# Patient Record
Sex: Female | Born: 1955 | Race: White | Hispanic: No | Smoking: Never smoker
Health system: Southern US, Community
[De-identification: ages and names within clinical notes are randomized; demographics above are authoritative.]

## PROBLEM LIST (undated history)

## (undated) DIAGNOSIS — J349 Unspecified disorder of nose and nasal sinuses: Secondary | ICD-10-CM

## (undated) DIAGNOSIS — G5601 Carpal tunnel syndrome, right upper limb: Secondary | ICD-10-CM

## (undated) DIAGNOSIS — Z87442 Personal history of urinary calculi: Secondary | ICD-10-CM

## (undated) DIAGNOSIS — M722 Plantar fascial fibromatosis: Secondary | ICD-10-CM

## (undated) DIAGNOSIS — K219 Gastro-esophageal reflux disease without esophagitis: Secondary | ICD-10-CM

## (undated) DIAGNOSIS — F419 Anxiety disorder, unspecified: Secondary | ICD-10-CM

## (undated) DIAGNOSIS — M1990 Unspecified osteoarthritis, unspecified site: Secondary | ICD-10-CM

## (undated) DIAGNOSIS — I4719 Other supraventricular tachycardia: Secondary | ICD-10-CM

## (undated) HISTORY — PX: CHOLECYSTECTOMY: SHX55

## (undated) HISTORY — PX: WISDOM TOOTH EXTRACTION: SHX21

## (undated) HISTORY — PX: VARICOSE VEIN SURGERY: SHX832

## (undated) HISTORY — PX: HYSTEROSCOPY: SHX211

## (undated) HISTORY — PX: OTHER SURGICAL HISTORY: SHX169

## (undated) HISTORY — PX: HYMENECTOMY: SHX5853

## (undated) HISTORY — DX: Other supraventricular tachycardia: I47.19

---

## 2002-02-26 ENCOUNTER — Ambulatory Visit (HOSPITAL_COMMUNITY): Admission: RE | Admit: 2002-02-26 | Discharge: 2002-02-26 | Payer: Self-pay | Admitting: Family Medicine

## 2002-02-26 ENCOUNTER — Encounter: Payer: Self-pay | Admitting: Family Medicine

## 2002-03-11 ENCOUNTER — Encounter: Payer: Self-pay | Admitting: Family Medicine

## 2002-03-11 ENCOUNTER — Ambulatory Visit (HOSPITAL_COMMUNITY): Admission: RE | Admit: 2002-03-11 | Discharge: 2002-03-11 | Payer: Self-pay | Admitting: Family Medicine

## 2002-04-19 ENCOUNTER — Encounter (HOSPITAL_COMMUNITY): Admission: RE | Admit: 2002-04-19 | Discharge: 2002-05-19 | Payer: Self-pay | Admitting: Family Medicine

## 2002-04-19 ENCOUNTER — Encounter: Payer: Self-pay | Admitting: Family Medicine

## 2002-05-28 ENCOUNTER — Ambulatory Visit (HOSPITAL_COMMUNITY): Admission: RE | Admit: 2002-05-28 | Discharge: 2002-05-28 | Payer: Self-pay | Admitting: Internal Medicine

## 2007-07-01 ENCOUNTER — Ambulatory Visit (HOSPITAL_COMMUNITY): Admission: RE | Admit: 2007-07-01 | Discharge: 2007-07-01 | Payer: Self-pay | Admitting: Family Medicine

## 2010-06-01 NOTE — Op Note (Signed)
NAME:  [name], [name]                            ACCOUNT NO.:  [id number]   MEDICAL RECORD NO.:  [id number]                   PATIENT TYPE:  AMB   LOCATION:  DAY                                  FACILITY:  [facility]   PHYSICIAN:  R. Roetta Sessions, M.D.              DATE OF BIRTH:  1955-02-19   DATE OF PROCEDURE:  DATE OF DISCHARGE:                                 OPERATIVE REPORT   PROCEDURE:  Diagnostic esophagogastroduodenoscopy.   INDICATIONS:  The patient is a 55 year old morbidly obese lady with  intermittent right upper quadrant abdominal pain typically related to binge  eating.  Prior gallbladder HIDA scan and upper GI series all within normal  limits.  She has had some improvement on Prilosec 20 mg orally daily.  She  tends to do well when she does not take in excessive calories at one time.  EGD is now being done to further evaluate her upper GI tract.  This approach  has been discussed with the patient previously.  The potential risks,  benefits and alternatives have been reviewed, questions answered and she is  agreeable.  Please see my May 14, 2002 consultation.   DESCRIPTION OF PROCEDURE:  Oxygen saturation, blood pressure, pulse and  respiration were monitored throughout the entire procedure.  Conscious  sedation with Versed 3 mg IV, Demerol 75 mg IV in divided doses. The  instrument was the Olympus video tip adult gastroscope.   FINDINGS:  Esophagus:  Examination of the tubular esophagus revealed no  mucosal abnormalities. The EG junction was easily traversed.  Stomach:  The gastric cavity was empty and insufflated well with air.  Examination of the gastric mucosa including the retroflexed view of the  proximal stomach and esophagogastric junction demonstrated no abnormalities.  Pylorus was patent and easily traversed.  Duodenum:  Bulb and second portion appeared normal.   THERAPY AND DIAGNOSTIC MANEUVERS:  None.   The patient tolerated the procedure well and was  reactive after endoscopy.   IMPRESSION:  Normal esophagus, stomach and duodenum through the second  portion.   RECOMMENDATIONS:  The patient is encouraged to moderate her caloric intake.  I suspect that she is experiencing some biliary colic-like symptoms from  overtaxing her gallbladder with intermittent large, boluses of  high fat containing foods intermittently.  I suspect her symptoms are  emanating from her gallbladder but I am more _________ cholecystectomy at  this time.  Further recommendations are to continue Prilosec  20 mg orally daily.  We will see her back in four to six weeks.                                                Jonathon Bellows, M.D.    RMR/MEDQ  D:  05/28/2002  T:  05/29/2002  Job:  [id number]   cc:   Angus G. Renard Matter, M.D.  [address]  [place]  [place] [address]  Fax: [phone]

## 2010-06-01 NOTE — H&P (Signed)
[name], [name]                              ACCOUNT NO.:  [id number]   MEDICAL RECORD NO.:  [id number]                  PATIENT TYPE:   LOCATION:                                       FACILITY:   PHYSICIAN:  R. Roetta Sessions, M.D.              DATE OF BIRTH:  06-13-1955   DATE OF ADMISSION:  DATE OF DISCHARGE:                                HISTORY & PHYSICAL   HISTORY AND PHYSICAL/CONSULTATION   CHIEF COMPLAINT:  The patient is a pleasant 55 year old morbidly obese lady  sent over courtesy of Dr. Ishmael Holter. [place] to further evaluate a 4-5 month  history of right upper quadrant abdominal pain.  The patient typically has  worsening symptoms after she indulges in high fat, high volume food intake  over the weekend, has intermittent epigastric right upper quadrant abdominal  pain throughout the day the first of the week, usually it radiates up into  her neck.  She describes a burning up into her right neck.  She has never  had a rash.  Some nausea but no associated vomiting.  She does have a  history of typical reflux symptoms, those have been well squelched with  Prilosec 20 mg orally daily which she started taking four months ago.  Does  not have any odynophagia or dysphagia.  Apparent work up this far has  included ultrasound of the abdomen which revealed a fatty appearing liver,  gallbladder physiologically distended without any other abnormalities.  Sonographic penetration of the abdomen was somewhat limited by the patient's  abdominal girth.  She has an echogenic liver, no biliary dilation. HIDA scan  demonstrated a rapid filling of the gallbladder, gallbladder EF 80% however,  she did have nausea but no real pain following ingestion of half and half.  She had an upper GI series which revealed no gross abnormalities aside from  mild impairment of esophageal motility.  She does not take any  nonsteroidal agents.  She does take over the counter sinus medications  unknown if  these contain aspirin or not.   PAST MEDICAL HISTORY:  Unremarkable for chronic illness.   PAST SURGICAL HISTORY:  1. Wisdom teeth extraction.  2. Hymen resected.   CURRENT MEDICATIONS:  1. Prilosec 20 mg daily.  2. Over the counter sinus medication.   ALLERGIES:  No known drug allergies.   FAMILY HISTORY:  Father with heart disease.  Mother is in good health.  No  family history of chronic GI or liver disease.   SOCIAL HISTORY:  The patient is divorced, no children.  She is a [place] history  [employer], broadcasting/film/video in [employer].  No tobacco or alcohol.   REVIEW OF SYSTEMS:  No chest pain on exertion.  No dyspnea.  No fever or  chills.   PHYSICAL EXAMINATION:  GENERAL:  Reveals a pleasant 55 year old morbidly  obese lady, resting comfortably.  VITAL SIGNS:  Weight  269, height 5 feet 2-1/2 inches, temp 96.5, BP 120/74,  pulse 74.  SKIN:  Warm and dry.  There is no jaundice.  No __________ stigmata for  chronic liver disease.  HEENT:  No scleral icterus.  Conjunctivae are pink.  Oral cavity, no  lesions.  JVD is not prominent.  CHEST:  Lungs are clear to auscultation.  CARDIAC:  Regular, rate and rhythm without murmur, gallop or rub.  BREAST:  Deferred.  ABDOMEN:  Massively obese.  Positive bowel sounds.  She has a vague  epigastric, right upper quadrant pain on palpation.  No obvious mass or  organomegaly but sensitivity on physical exam is limited by abdominal girth.  EXTREMITIES:  Trace lower extremity edema.   IMPRESSION:  The patient is a pleasant 55 year old lady with rather unusual  intermittent epigastric, right upper quadrant pain, loosely associated with  over indulgence in food.  Symptoms sound somewhat biliary in origin.  She  does have a history of gastroesophageal reflux disease.  She differentiates  those symptoms from the symptoms for which she has sought out consultation.  Those symptoms have been well controlled on Prilosec.   No gross abnormalities on upper GI  series.  Gallbladder ultrasound and HIDA  were really okay.   Occult gallbladder disease, sphincter of Oddi dysfunction, or luminal upper  GI tract pathology remain in the differential at this point in time.   RECOMMENDATIONS:  I have offered this lady an EGD just to go ahead and  survey her upper GI tract directly to make sure she does not have a mucosa  process contributing to her symptoms.  I discussed this approach, the  potential risks, benefits, alternatives with the patient.  The potential  risks, benefits and alternatives have been explained at some length.  Her  questions were answered.  Will also ask her to obtain a set of LFTs, amylase  and lipase the first of the week, which generally has worsening of her  symptoms in the near future.  Will make further recommendations.   I would like to thank Dr. Butch Penny for letting me see this nice lady  today.                                                Jonathon Bellows, M.D.    RMR/MEDQ  D:  05/14/2002  T:  05/14/2002  Job:  [id number]   cc:   Angus G. Renard Matter, M.D.  [address]  [place]  [place] [address]  Fax: [phone]

## 2010-06-08 IMAGING — US US EXTREM LOW VENOUS*L*
2 series · 13 of 24 positions shown · non-contrast
Comparison: None

CLINICAL DATA: Left lower extremity pain and swelling, recent fall



[Series 1: unknown · 0.13mm/px · 11 of 28 slices shown (1 of 2)]
[im 1/28]
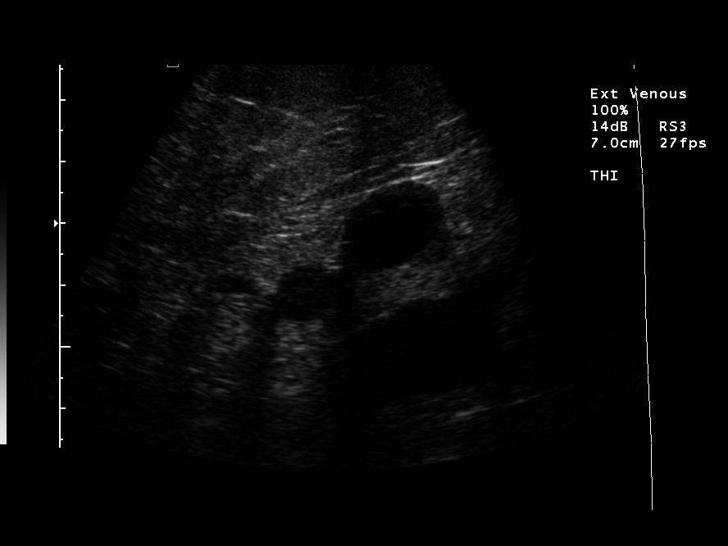
[im 3/28]
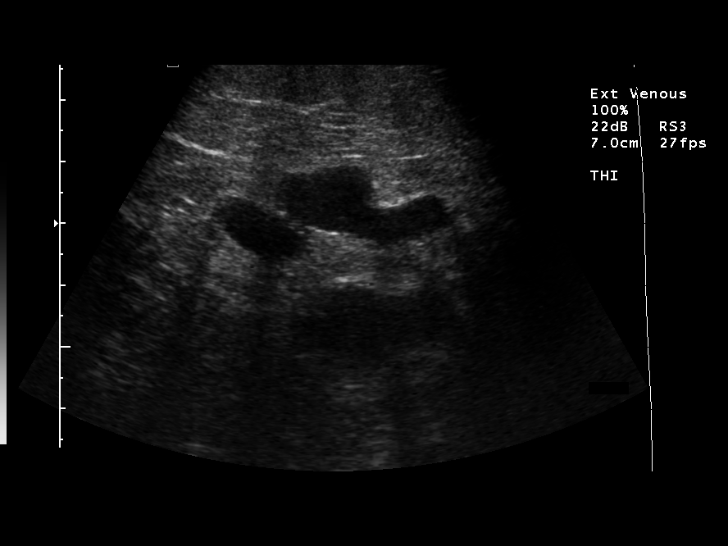
[im 6/28]
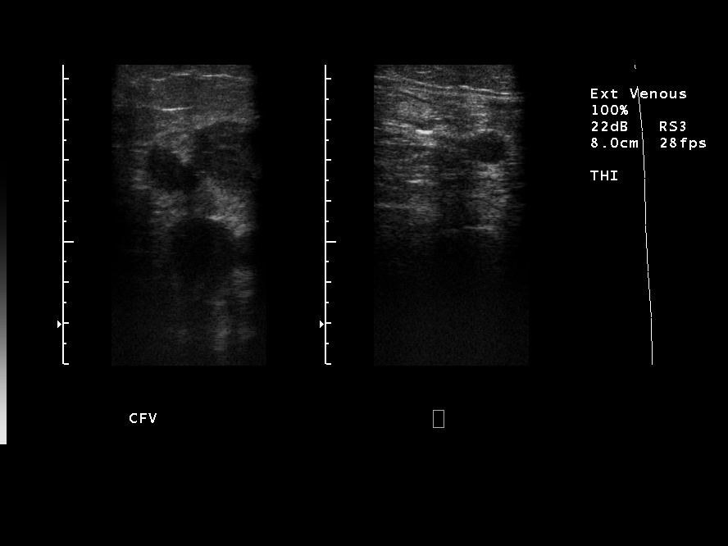
[im 9/28]
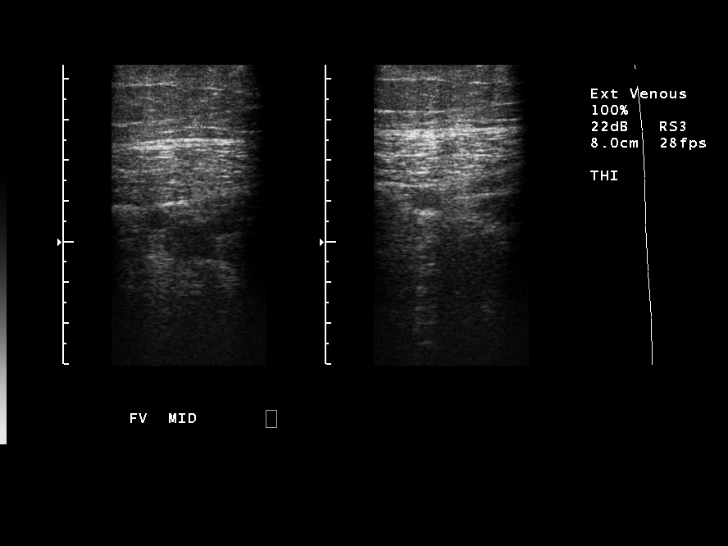
[im 12/28]
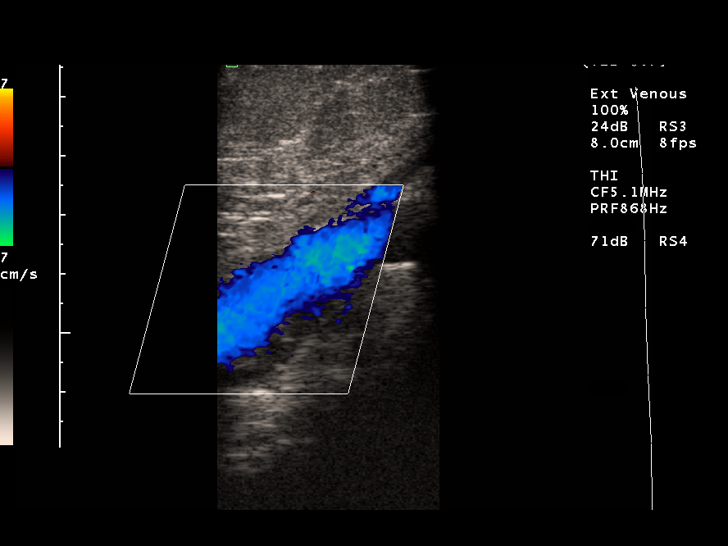
[im 15/28]
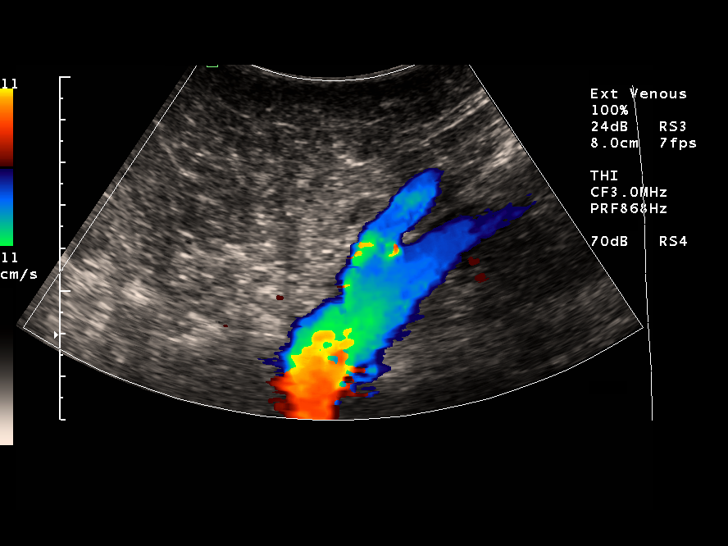
[im 18/28]
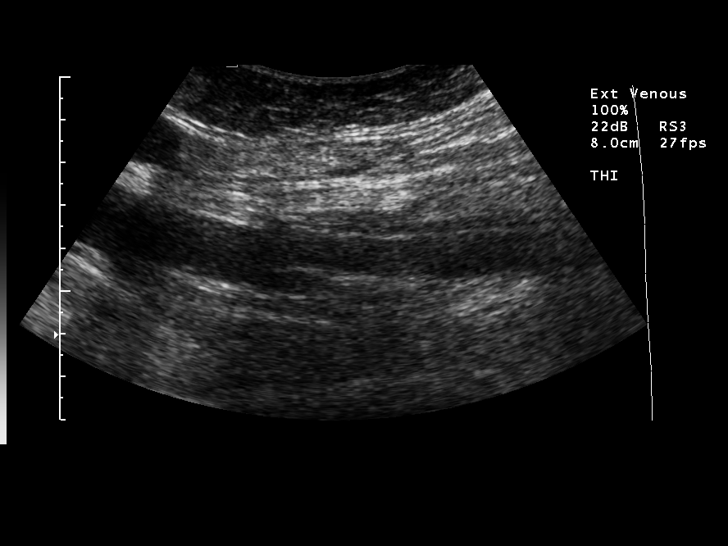
[im 19/28]
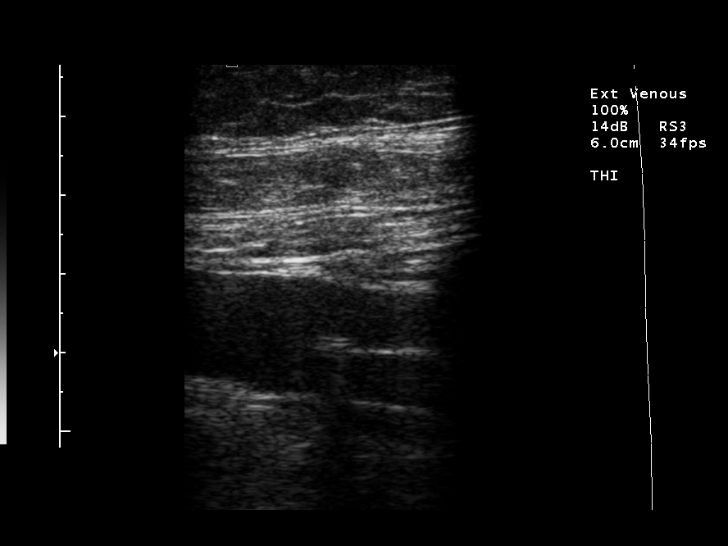
[im 22/28]
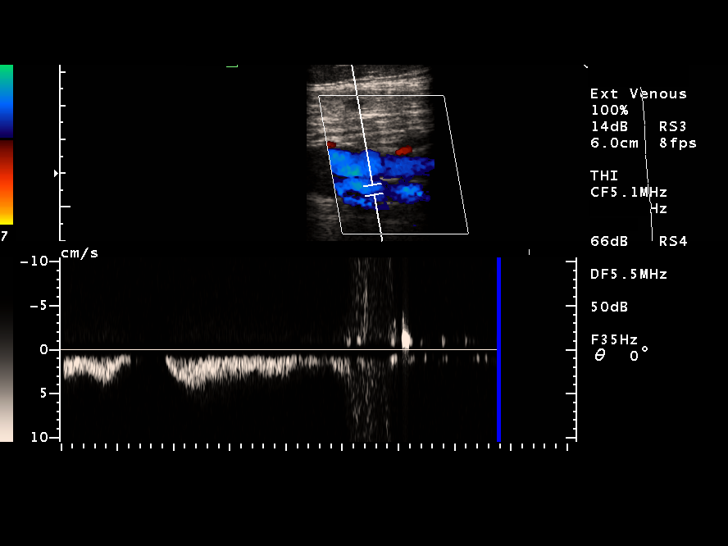
[im 25/28]
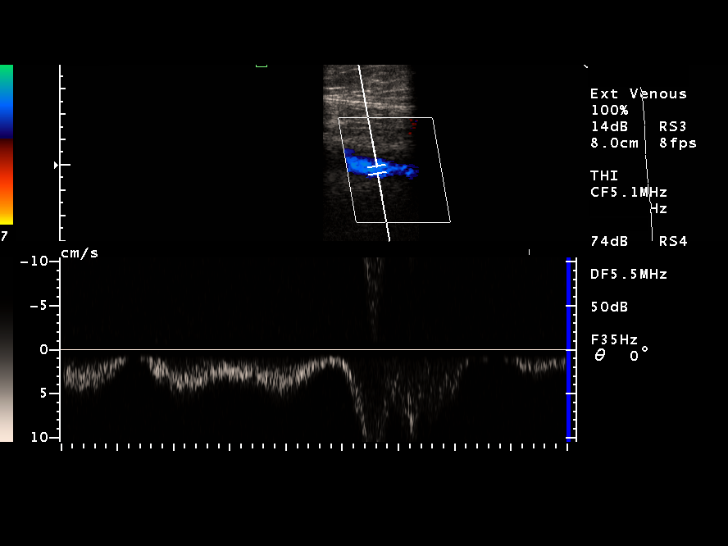
[im 28/28]
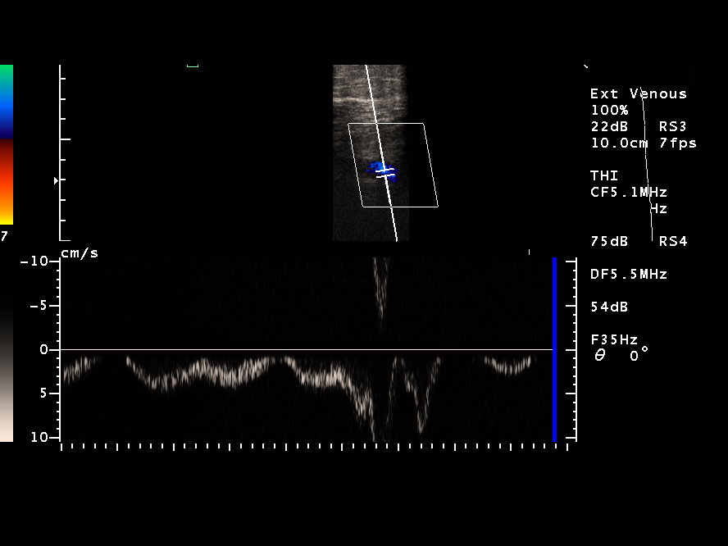

[Series 1: unknown · 2 of 6 slices shown (2 of 2)]
[im 2/6]
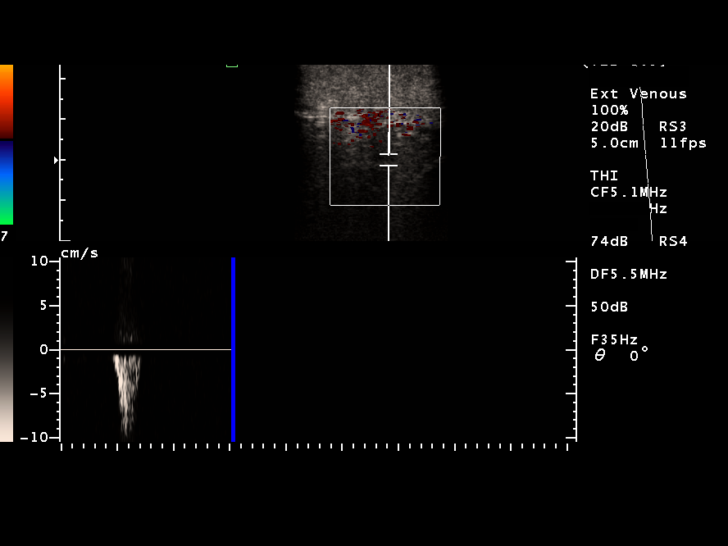
[im 6/6]
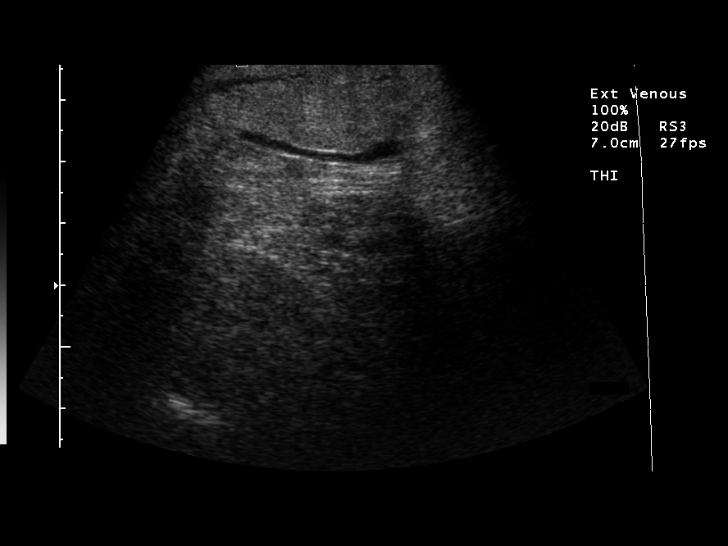

[13 of 24 positions shown; findings below may reference images not displayed]

FINDINGS: Deep venous system patent and compressible from left groin through
popliteal fossa.
Spontaneous venous flow present with intact augmentation and
evidence of respiratory phasicity.
No intraluminal thrombus identified.
Visualized portion of the greater saphenous system unremarkable.
Dilated varicosity identified in greater saphenous vein just distal
to saphenofemoral junction, patent and compressible.
Patient is nontender this site.
No definite acute process.
IMPRESSION: No evidence of deep venous thrombosis in left lower extremity.
Large varicosity at greater saphenous vein at left groin without
evidence of superficial thrombophlebitis.

## 2014-02-14 HISTORY — PX: HYSTEROSCOPY: SHX211

## 2014-04-15 HISTORY — PX: CERVICAL CONIZATION W/BX: SHX1330

## 2014-04-15 HISTORY — PX: DILATION AND CURETTAGE OF UTERUS: SHX78

## 2014-04-20 DIAGNOSIS — N8501 Benign endometrial hyperplasia: Secondary | ICD-10-CM | POA: Insufficient documentation

## 2014-07-15 DIAGNOSIS — Z87442 Personal history of urinary calculi: Secondary | ICD-10-CM

## 2014-07-15 HISTORY — DX: Personal history of urinary calculi: Z87.442

## 2014-08-31 ENCOUNTER — Other Ambulatory Visit: Payer: Self-pay | Admitting: Orthopedic Surgery

## 2014-09-15 DIAGNOSIS — G5601 Carpal tunnel syndrome, right upper limb: Secondary | ICD-10-CM

## 2014-09-15 HISTORY — DX: Carpal tunnel syndrome, right upper limb: G56.01

## 2014-09-27 ENCOUNTER — Encounter (HOSPITAL_BASED_OUTPATIENT_CLINIC_OR_DEPARTMENT_OTHER): Payer: Self-pay | Admitting: *Deleted

## 2014-09-27 NOTE — Pre-Procedure Instructions (Signed)
To come for anesthesia airway evaluation. 

## 2014-09-28 NOTE — Progress Notes (Signed)
Pt. Presented today for anesthesia eval/ consult completed via Dr Marcie Bal.  Dr Marcie Bal interviewed pt. And states she is cleared for her impending surgery on  Sept 20th via Dr Fredna Dow.  Spoke with pt. About POC morning of surgery and anwsered questioned. Pt verbalized understanding at this time

## 2014-10-04 ENCOUNTER — Encounter (HOSPITAL_BASED_OUTPATIENT_CLINIC_OR_DEPARTMENT_OTHER)
Admission: RE | Disposition: A | Discharge status: Home / Self Care | Payer: Self-pay | Source: Ambulatory Visit | Attending: Orthopedic Surgery

## 2014-10-04 ENCOUNTER — Ambulatory Visit (HOSPITAL_BASED_OUTPATIENT_CLINIC_OR_DEPARTMENT_OTHER)
Admission: RE | Admit: 2014-10-04 | Discharge: 2014-10-04 | Disposition: A | Discharge status: Home / Self Care | Payer: BC Managed Care – PPO | Source: Ambulatory Visit | Attending: Orthopedic Surgery | Admitting: Orthopedic Surgery

## 2014-10-04 ENCOUNTER — Ambulatory Visit (HOSPITAL_BASED_OUTPATIENT_CLINIC_OR_DEPARTMENT_OTHER): Payer: BC Managed Care – PPO | Admitting: Certified Registered"

## 2014-10-04 ENCOUNTER — Encounter (HOSPITAL_BASED_OUTPATIENT_CLINIC_OR_DEPARTMENT_OTHER): Payer: Self-pay | Admitting: Certified Registered"

## 2014-10-04 DIAGNOSIS — G5602 Carpal tunnel syndrome, left upper limb: Secondary | ICD-10-CM | POA: Diagnosis not present

## 2014-10-04 DIAGNOSIS — G5601 Carpal tunnel syndrome, right upper limb: Secondary | ICD-10-CM | POA: Insufficient documentation

## 2014-10-04 DIAGNOSIS — Z79899 Other long term (current) drug therapy: Secondary | ICD-10-CM | POA: Diagnosis not present

## 2014-10-04 DIAGNOSIS — M1990 Unspecified osteoarthritis, unspecified site: Secondary | ICD-10-CM | POA: Diagnosis not present

## 2014-10-04 DIAGNOSIS — K219 Gastro-esophageal reflux disease without esophagitis: Secondary | ICD-10-CM | POA: Insufficient documentation

## 2014-10-04 DIAGNOSIS — Z791 Long term (current) use of non-steroidal anti-inflammatories (NSAID): Secondary | ICD-10-CM | POA: Diagnosis not present

## 2014-10-04 DIAGNOSIS — Z6841 Body Mass Index (BMI) 40.0 and over, adult: Secondary | ICD-10-CM | POA: Insufficient documentation

## 2014-10-04 DIAGNOSIS — Z7982 Long term (current) use of aspirin: Secondary | ICD-10-CM | POA: Insufficient documentation

## 2014-10-04 DIAGNOSIS — F419 Anxiety disorder, unspecified: Secondary | ICD-10-CM | POA: Diagnosis not present

## 2014-10-04 HISTORY — DX: Plantar fascial fibromatosis: M72.2

## 2014-10-04 HISTORY — DX: Unspecified disorder of nose and nasal sinuses: J34.9

## 2014-10-04 HISTORY — DX: Gastro-esophageal reflux disease without esophagitis: K21.9

## 2014-10-04 HISTORY — DX: Anxiety disorder, unspecified: F41.9

## 2014-10-04 HISTORY — DX: Personal history of urinary calculi: Z87.442

## 2014-10-04 HISTORY — DX: Unspecified osteoarthritis, unspecified site: M19.90

## 2014-10-04 HISTORY — DX: Carpal tunnel syndrome, right upper limb: G56.01

## 2014-10-04 HISTORY — PX: CARPAL TUNNEL RELEASE: SHX101

## 2014-10-04 SURGERY — CARPAL TUNNEL RELEASE
Anesthesia: Monitor Anesthesia Care | Site: Wrist | Laterality: Right

## 2014-10-04 MED ORDER — PROPOFOL INFUSION 10 MG/ML OPTIME
INTRAVENOUS | Status: DC | PRN
  Administered 2014-10-04: 75 ug/kg/min via INTRAVENOUS

## 2014-10-04 MED ORDER — FENTANYL CITRATE (PF) 100 MCG/2ML IJ SOLN: 25.0000 ug | INTRAMUSCULAR | Status: DC | PRN

## 2014-10-04 MED ORDER — ONDANSETRON HCL 4 MG/2ML IJ SOLN
INTRAMUSCULAR | Status: AC
  Filled 2014-10-04: qty 2

## 2014-10-04 MED ORDER — CHLORHEXIDINE GLUCONATE 4 % EX LIQD: 60.0000 mL | Freq: Once | CUTANEOUS | Status: DC

## 2014-10-04 MED ORDER — LIDOCAINE HCL (CARDIAC) 20 MG/ML IV SOLN
INTRAVENOUS | Status: DC | PRN
  Administered 2014-10-04: 20 mg via INTRAVENOUS

## 2014-10-04 MED ORDER — ACETAMINOPHEN 160 MG/5ML PO SOLN: 325.0000 mg | ORAL | Status: DC | PRN

## 2014-10-04 MED ORDER — MIDAZOLAM HCL 2 MG/2ML IJ SOLN
INTRAMUSCULAR | Status: AC
  Filled 2014-10-04: qty 4

## 2014-10-04 MED ORDER — CEFAZOLIN SODIUM-DEXTROSE 2-3 GM-% IV SOLR
INTRAVENOUS | Status: AC
  Filled 2014-10-04: qty 50

## 2014-10-04 MED ORDER — LACTATED RINGERS IV SOLN
INTRAVENOUS | Status: DC
  Administered 2014-10-04: 09:00:00 via INTRAVENOUS

## 2014-10-04 MED ORDER — GLYCOPYRROLATE 0.2 MG/ML IJ SOLN: 0.2000 mg | Freq: Once | INTRAMUSCULAR | Status: DC | PRN

## 2014-10-04 MED ORDER — FENTANYL CITRATE (PF) 100 MCG/2ML IJ SOLN
INTRAMUSCULAR | Status: AC
  Filled 2014-10-04: qty 4

## 2014-10-04 MED ORDER — DEXTROSE 5 % IV SOLN: 3.0000 g | INTRAVENOUS | Status: DC

## 2014-10-04 MED ORDER — HYDROCODONE-ACETAMINOPHEN 5-325 MG PO TABS: 1.0000 | ORAL_TABLET | Freq: Four times a day (QID) | ORAL | Status: DC | PRN

## 2014-10-04 MED ORDER — ACETAMINOPHEN 325 MG PO TABS: 325.0000 mg | ORAL_TABLET | ORAL | Status: DC | PRN

## 2014-10-04 MED ORDER — ONDANSETRON HCL 4 MG/2ML IJ SOLN
INTRAMUSCULAR | Status: DC | PRN
  Administered 2014-10-04: 4 mg via INTRAVENOUS

## 2014-10-04 MED ORDER — CEFAZOLIN SODIUM-DEXTROSE 2-3 GM-% IV SOLR
2.0000 g | INTRAVENOUS | Status: AC
  Administered 2014-10-04: 2 g via INTRAVENOUS

## 2014-10-04 MED ORDER — OXYCODONE HCL 5 MG/5ML PO SOLN: 5.0000 mg | Freq: Once | ORAL | Status: DC | PRN

## 2014-10-04 MED ORDER — SCOPOLAMINE 1 MG/3DAYS TD PT72: 1.0000 | MEDICATED_PATCH | Freq: Once | TRANSDERMAL | Status: DC | PRN

## 2014-10-04 MED ORDER — LIDOCAINE HCL (PF) 0.5 % IJ SOLN
INTRAMUSCULAR | Status: DC | PRN
  Administered 2014-10-04: 30 mL via INTRAVENOUS

## 2014-10-04 MED ORDER — OXYCODONE HCL 5 MG PO TABS: 5.0000 mg | ORAL_TABLET | Freq: Once | ORAL | Status: DC | PRN

## 2014-10-04 MED ORDER — MIDAZOLAM HCL 2 MG/2ML IJ SOLN
1.0000 mg | INTRAMUSCULAR | Status: DC | PRN
Start: 2014-10-04 — End: 2014-10-04
  Administered 2014-10-04: 2 mg via INTRAVENOUS

## 2014-10-04 MED ORDER — FENTANYL CITRATE (PF) 100 MCG/2ML IJ SOLN
50.0000 ug | INTRAMUSCULAR | Status: DC | PRN
  Administered 2014-10-04 (×2): 50 ug via INTRAVENOUS

## 2014-10-04 MED ORDER — BUPIVACAINE HCL (PF) 0.25 % IJ SOLN
INTRAMUSCULAR | Status: DC | PRN
  Administered 2014-10-04: 10 mL

## 2014-10-04 SURGICAL SUPPLY — 33 items
BLADE SURG 15 STRL LF DISP TIS (BLADE) ×1 IMPLANT
BLADE SURG 15 STRL SS (BLADE) ×3
BNDG CMPR 9X4 STRL LF SNTH (GAUZE/BANDAGES/DRESSINGS)
BNDG COHESIVE 3X5 TAN STRL LF (GAUZE/BANDAGES/DRESSINGS) ×3 IMPLANT
BNDG ESMARK 4X9 LF (GAUZE/BANDAGES/DRESSINGS) IMPLANT
BNDG GAUZE ELAST 4 BULKY (GAUZE/BANDAGES/DRESSINGS) ×3 IMPLANT
CHLORAPREP W/TINT 26ML (MISCELLANEOUS) ×3 IMPLANT
CORDS BIPOLAR (ELECTRODE) ×3 IMPLANT
COVER BACK TABLE 60X90IN (DRAPES) ×3 IMPLANT
COVER MAYO STAND STRL (DRAPES) ×3 IMPLANT
CUFF TOURNIQUET SINGLE 18IN (TOURNIQUET CUFF) ×3 IMPLANT
DRAPE EXTREMITY T 121X128X90 (DRAPE) ×3 IMPLANT
DRAPE SURG 17X23 STRL (DRAPES) ×3 IMPLANT
DRSG PAD ABDOMINAL 8X10 ST (GAUZE/BANDAGES/DRESSINGS) ×3 IMPLANT
GAUZE SPONGE 4X4 12PLY STRL (GAUZE/BANDAGES/DRESSINGS) ×3 IMPLANT
GAUZE XEROFORM 1X8 LF (GAUZE/BANDAGES/DRESSINGS) ×3 IMPLANT
GLOVE BIOGEL PI IND STRL 8.5 (GLOVE) ×1 IMPLANT
GLOVE BIOGEL PI INDICATOR 8.5 (GLOVE) ×2
GLOVE SURG ORTHO 8.0 STRL STRW (GLOVE) ×3 IMPLANT
GOWN STRL REUS W/ TWL LRG LVL3 (GOWN DISPOSABLE) ×1 IMPLANT
GOWN STRL REUS W/TWL LRG LVL3 (GOWN DISPOSABLE) ×3
GOWN STRL REUS W/TWL XL LVL3 (GOWN DISPOSABLE) ×3 IMPLANT
NDL PRECISIONGLIDE 27X1.5 (NEEDLE) IMPLANT
NEEDLE PRECISIONGLIDE 27X1.5 (NEEDLE) IMPLANT
NS IRRIG 1000ML POUR BTL (IV SOLUTION) ×3 IMPLANT
PACK BASIN DAY SURGERY FS (CUSTOM PROCEDURE TRAY) ×3 IMPLANT
STOCKINETTE 4X48 STRL (DRAPES) ×3 IMPLANT
SUT ETHILON 4 0 PS 2 18 (SUTURE) ×3 IMPLANT
SUT VICRYL 4-0 PS2 18IN ABS (SUTURE) IMPLANT
SYR BULB 3OZ (MISCELLANEOUS) ×3 IMPLANT
SYR CONTROL 10ML LL (SYRINGE) IMPLANT
TOWEL OR 17X24 6PK STRL BLUE (TOWEL DISPOSABLE) ×3 IMPLANT
UNDERPAD 30X30 (UNDERPADS AND DIAPERS) ×3 IMPLANT

## 2014-10-04 NOTE — Discharge Instructions (Addendum)

## 2014-10-04 NOTE — Transfer of Care (Signed)
Immediate Anesthesia Transfer of Care Note  Patient: [name]  Procedure(s) Performed: Procedure(s) with comments: RIGHT CARPAL TUNNEL RELEASE (Right) - REG/FAB  Patient Location: PACU  Anesthesia Type:Bier block  Level of Consciousness: awake, alert , oriented and patient cooperative  Airway & Oxygen Therapy: Patient Spontanous Breathing and Patient connected to face mask oxygen  Post-op Assessment: Report given to RN and Post -op Vital signs reviewed and stable  Post vital signs: Reviewed and stable  Last Vitals:  Filed Vitals:   10/04/14 1045  BP:   Pulse: 91  Temp:   Resp:     Complications: No apparent anesthesia complications

## 2014-10-04 NOTE — Op Note (Signed)
Dictation Number [id number]

## 2014-10-04 NOTE — Anesthesia Preprocedure Evaluation (Addendum)
Anesthesia Evaluation  Patient identified by MRN, date of birth, ID band Patient awake    Reviewed: Allergy & Precautions, NPO status , Patient's Chart, lab work & pertinent test results  History of Anesthesia Complications Negative for: history of anesthetic complications  Airway Mallampati: II  TM Distance: >3 FB Neck ROM: Full    Dental  (+) Teeth Intact   Pulmonary neg pulmonary ROS,    breath sounds clear to auscultation       Cardiovascular negative cardio ROS   Rhythm:Regular     Neuro/Psych neg Seizures PSYCHIATRIC DISORDERS Anxiety  Neuromuscular disease    GI/Hepatic Neg liver ROS, GERD  Medicated and Controlled,  Endo/Other  Morbid obesity  Renal/GU negative Renal ROS     Musculoskeletal  (+) Arthritis ,   Abdominal   Peds  Hematology negative hematology ROS (+)   Anesthesia Other Findings   Reproductive/Obstetrics                            Anesthesia Physical Anesthesia Plan  ASA: II  Anesthesia Plan: General   Post-op Pain Management:    Induction:   Airway Management Planned: Nasal Cannula  Additional Equipment: None  Intra-op Plan:   Post-operative Plan:   Informed Consent: I have reviewed the patients History and Physical, chart, labs and discussed the procedure including the risks, benefits and alternatives for the proposed anesthesia with the patient or authorized representative who has indicated his/her understanding and acceptance.   Dental advisory given  Plan Discussed with: CRNA and Surgeon  Anesthesia Plan Comments:        Anesthesia Quick Evaluation

## 2014-10-04 NOTE — Op Note (Signed)
NAMEELESIA, PEMBERTON                 ACCOUNT NO.:  [id number]  MEDICAL RECORD NO.:  [id number]  LOCATION:                                 FACILITY:  PHYSICIAN:  Daryll Brod, M.D.            DATE OF BIRTH:  DATE OF PROCEDURE:  10/04/2014 DATE OF DISCHARGE:                              OPERATIVE REPORT   PREOPERATIVE DIAGNOSIS:  Carpal tunnel syndrome, right hand.  POSTOPERATIVE DIAGNOSIS:  Carpal tunnel syndrome, right hand.  OPERATION:  Decompression of right median nerve.  SURGEON:  Daryll Brod, M.D.  ANESTHESIA:  Forearm-based IV regional with local infiltration.  ANESTHESIOLOGIST:  Dr. Ermalene Postin.  HISTORY:  The patient is a 59 year old female with a history of numbness and tingling in her hand, evidence of carpal tunnel syndrome.  Nerve conduction is positive.  This has not responded to conservative treatment.  She has elected to undergo surgical release of the carpal canal, median nerve to the right hand.  Pre, peri, and postoperative courses have been discussed along with risks and complications.  She is aware that there is no guarantee with surgery; possibility of infection; recurrence of injury to arteries, nerves, tendons; incomplete relief of symptoms; dystrophy.  In preoperative area, the patient is seen, the extremity marked by both patient and surgeon.  Antibiotic given.  PROCEDURE IN DETAIL:  The patient was brought to the operating room, where a forearm-based IV regional anesthetic was carried out without difficulty.  She was prepped using ChloraPrep, supine position with the right arm free.  A 3-minute dry time was allowed.  Time-out taken, confirming the patient and procedure.  A longitudinal incision was made in the right palm, carried down through subcutaneous tissue.  Bleeders were electrocauterized.  Palmar fascia was split.  Superficial palmar arch identified.  Flexor tendon to the ring and little finger identified to the ulnar side of median nerve.  Carpal  retinaculum was incised with sharp dissection.  Right angle and Sewall retractor were placed between skin and forearm fascia.  The fascia was released for approximately 2 cm proximal to the wrist crease under direct vision.  The canal was explored.  The area of compression to the nerve was apparent.  Motor branch entered into muscle distally.  The wound was copiously irrigated with saline, and the skin was closed with interrupted 4-0 nylon sutures. Local infiltration with 0.25% bupivacaine without epinephrine was given, 10 mL was used.  Sterile compressive dressing with the fingers free was applied.  On deflation of the tourniquet, all fingers immediately pinked.  She was taken to the recovery room for observation in satisfactory condition.  She will be discharged home to return to the [place] in 1 week on [place].          ______________________________ Daryll Brod, M.D.     GK/MEDQ  D:  10/04/2014  T:  10/04/2014  Job:  [id number]

## 2014-10-04 NOTE — Anesthesia Postprocedure Evaluation (Signed)
  Anesthesia Post-op Note  Patient: [name]  Procedure(s) Performed: Procedure(s) with comments: RIGHT CARPAL TUNNEL RELEASE (Right) - REG/FAB  Patient Location: PACU  Anesthesia Type:Bier block  Level of Consciousness: awake  Airway and Oxygen Therapy: Patient Spontanous Breathing  Post-op Pain: none  Post-op Assessment: Post-op Vital signs reviewed, Patient's Cardiovascular Status Stable, Respiratory Function Stable, Patent Airway, No signs of Nausea or vomiting and Pain level controlled              Post-op Vital Signs: Reviewed and stable  Last Vitals:  Filed Vitals:   10/04/14 1135  BP: 147/79  Pulse: 70  Temp: 36.6 C  Resp: 20    Complications: No apparent anesthesia complications

## 2014-10-04 NOTE — Anesthesia Procedure Notes (Signed)
Anesthesia Regional Block:  Bier block (IV Regional)  Pre-Anesthetic Checklist: ,, timeout performed, Correct Patient, Correct Site, Correct Laterality, Correct Procedure,, site marked, surgical consent,, at surgeon's request Needles:  Injection technique: Single-shot  Needle Type: Other      Needle Gauge: 20 and 20 G    Additional Needles: Bier block (IV Regional) Narrative:   Performed by: Personally    Procedure Name: MAC Date/Time: 10/04/2014 10:15 AM Performed by: BLOCKER, TIMOTHY D Pre-anesthesia Checklist: Patient identified, Emergency Drugs available, Suction available, Patient being monitored and Timeout performed Patient Re-evaluated:Patient Re-evaluated prior to inductionOxygen Delivery Method: Simple face mask

## 2014-10-04 NOTE — Brief Op Note (Signed)
10/04/2014  10:41 AM  PATIENT:  Ulyses Southward Privitera  59 y.o. female  PRE-OPERATIVE DIAGNOSIS:  CARPAL TUNNEL SYNDROME RIGHT  POST-OPERATIVE DIAGNOSIS:  carpal tunnel syndrome right  PROCEDURE:  Procedure(s) with comments: RIGHT CARPAL TUNNEL RELEASE (Right) - REG/FAB  SURGEON:  Surgeon(s) and Role:    * Daryll Brod, MD - Primary  PHYSICIAN ASSISTANT:   ASSISTANTS: none   ANESTHESIA:   local and regional  EBL:  Total I/O In: 700 [I.V.:700] Out: -   BLOOD ADMINISTERED:none  DRAINS: none   LOCAL MEDICATIONS USED:  BUPIVICAINE   SPECIMEN:  No Specimen  DISPOSITION OF SPECIMEN:  N/A  COUNTS:  YES  TOURNIQUET:   Total Tourniquet Time Documented: Forearm (Right) - 16 minutes Total: Forearm (Right) - 16 minutes   DICTATION: .Other Dictation: Dictation Number [id number]  PLAN OF CARE: Discharge to home after PACU  PATIENT DISPOSITION:  PACU - hemodynamically stable.

## 2014-10-04 NOTE — H&P (Signed)
[name] is a 59 year old right hand dominant female who comes in complaining of bilateral hand numbness and tingling for the past 5-10 years increasing over the past 3 especially of using braces. She is also complaining of occasional discomfort in her CMC joints bilaterally, right much greater than left. She has been wearing night splints. She has taken Aleve for her knee. She has no history of injury to the hand or neck. She is awakened variably at night. She states writing increases it and driving a car increases the numbness and tingling. She relates this primarily to the median nerve distribution. She complains of intermittent severe tingling pain. Activity and exercise makes this worse, rest helps. There is no history of diabetes or gout. She has a history of thyroid problems and arthritis. There is a family history of diabetes and arthritis. She has had her nerve conductions done by Dr. Zebedee Iba revealing carpal tunnel syndrome bilaterally with motor delays of 5.3 on the left, 5.6 on the right, sensory delay of 3.5 on the left and 3.6 on the right, amplitude diminution to 17 on the left and 16 on the right. We have discussed the implications with her. She is advised we can attempt an injection to see if this will resolve this for her and these are likely to be temporary.  She is informed there may be a component coming from her neck. She is scheduled for nerve conductions by Dr. Zebedee Iba.  PAST MEDICAL HISTORY:  She has no drug allergies. She is on omeprazole, Naprosyn, aspirin, and Paroxetine. She has had oral surgery for wisdom teeth.  FAMILY MEDICAL HISTORY: Positive for diabetes, heart disease and arthritis.  SOCIAL HISTORY:  She does not smoke or use alcohol. She is divorced and widowed. She is an Tourist information centre manager.  REVIEW OF SYSTEMS: Positive for glasses and pneumonia, otherwise negative 14 points.  [name] is an 59 y.o. female.   Chief Complaint: numbness right hand HPI: see above  Past  Medical History  Diagnosis Date  . Arthritis     knees, thumbs  . Plantar fasciitis of right foot 09/2014  . GERD (gastroesophageal reflux disease)   . History of kidney stones 07/2014  . Anxiety   . Sinus problem   . Carpal tunnel syndrome of right wrist 09/2014    Past Surgical History  Procedure Laterality Date  . Cervical conization w/bx  04/2014  . Dilation and curettage of uterus  04/2014  . Wisdom tooth extraction      as a teenager  . Hymenectomy      History reviewed. No pertinent family history. Social History:  reports that she has never smoked. She has never used smokeless tobacco. She reports that she does not drink alcohol or use illicit drugs.  Allergies: No Known Allergies  Medications Prior to Admission  Medication Sig Dispense Refill  . aspirin 81 MG tablet Take 81 mg by mouth daily.    . naproxen sodium (ANAPROX) 220 MG tablet Take 220 mg by mouth 2 (two) times daily with a meal.    . omeprazole (PRILOSEC) 20 MG capsule Take 20 mg by mouth daily.    Marland Kitchen PARoxetine (PAXIL) 10 MG tablet Take 10 mg by mouth daily.      No results found for this or any previous visit (from the past 48 hour(s)).  No results found.   Pertinent items are noted in HPI.  Blood pressure 143/69, pulse 79, temperature 98.3 F (36.8 C), temperature source Oral, resp.  rate 20, height 5\' 2"  (1.575 m), weight 127.007 kg (280 lb), SpO2 98 %.  General appearance: alert, cooperative and appears stated age Head: Normocephalic, without obvious abnormality Neck: no JVD Resp: clear to auscultation bilaterally Cardio: regular rate and rhythm, S1, S2 normal, no murmur, click, rub or gallop GI: soft, non-tender; bowel sounds normal; no masses,  no organomegaly Extremities: numbness right hand Pulses: 2+ and symmetric Skin: Skin color, texture, turgor normal. 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   DIAGNOSIS: (1)  bilateral carpal tunnel syndrome. (2) Possible double crush.  PLAN: She states she would like to proceed to have this surgically released. Pre, peri and post op care are discussed along with risks and complications. Patient is aware there is no guarantee with surgery, possibility of infection, injury to arteries, nerves, and tendons, incomplete relief and dystrophy. She would like to have her right side done.  She is scheduled for right carpal tunnel release as an outpatient under regional anesthesia.  KUZMA,GARY R 10/04/2014, 9:09 AM

## 2014-10-05 ENCOUNTER — Encounter (HOSPITAL_BASED_OUTPATIENT_CLINIC_OR_DEPARTMENT_OTHER): Payer: Self-pay | Admitting: Orthopedic Surgery

## 2014-11-01 ENCOUNTER — Other Ambulatory Visit: Payer: Self-pay | Admitting: Orthopedic Surgery

## 2014-11-09 ENCOUNTER — Encounter (HOSPITAL_BASED_OUTPATIENT_CLINIC_OR_DEPARTMENT_OTHER): Payer: Self-pay | Admitting: *Deleted

## 2014-11-09 NOTE — Progress Notes (Signed)
Patient had right CTR 10-04-14 here at [facility]. She came in for an anesthesia consult for that surgery and was OK for [facility].

## 2014-11-15 ENCOUNTER — Ambulatory Visit (HOSPITAL_BASED_OUTPATIENT_CLINIC_OR_DEPARTMENT_OTHER)
Admission: RE | Admit: 2014-11-15 | Discharge: 2014-11-15 | Disposition: A | Discharge status: Home / Self Care | Payer: BC Managed Care – PPO | Source: Ambulatory Visit | Attending: Orthopedic Surgery | Admitting: Orthopedic Surgery

## 2014-11-15 ENCOUNTER — Ambulatory Visit (HOSPITAL_BASED_OUTPATIENT_CLINIC_OR_DEPARTMENT_OTHER): Payer: BC Managed Care – PPO | Admitting: Anesthesiology

## 2014-11-15 ENCOUNTER — Encounter (HOSPITAL_BASED_OUTPATIENT_CLINIC_OR_DEPARTMENT_OTHER): Payer: Self-pay

## 2014-11-15 ENCOUNTER — Encounter (HOSPITAL_BASED_OUTPATIENT_CLINIC_OR_DEPARTMENT_OTHER)
Admission: RE | Disposition: A | Discharge status: Home / Self Care | Payer: Self-pay | Source: Ambulatory Visit | Attending: Orthopedic Surgery

## 2014-11-15 DIAGNOSIS — Z79899 Other long term (current) drug therapy: Secondary | ICD-10-CM | POA: Diagnosis not present

## 2014-11-15 DIAGNOSIS — G5603 Carpal tunnel syndrome, bilateral upper limbs: Secondary | ICD-10-CM | POA: Diagnosis present

## 2014-11-15 DIAGNOSIS — K219 Gastro-esophageal reflux disease without esophagitis: Secondary | ICD-10-CM | POA: Insufficient documentation

## 2014-11-15 DIAGNOSIS — M1990 Unspecified osteoarthritis, unspecified site: Secondary | ICD-10-CM | POA: Diagnosis not present

## 2014-11-15 DIAGNOSIS — Z7982 Long term (current) use of aspirin: Secondary | ICD-10-CM | POA: Insufficient documentation

## 2014-11-15 DIAGNOSIS — Z6841 Body Mass Index (BMI) 40.0 and over, adult: Secondary | ICD-10-CM | POA: Diagnosis not present

## 2014-11-15 DIAGNOSIS — Z791 Long term (current) use of non-steroidal anti-inflammatories (NSAID): Secondary | ICD-10-CM | POA: Insufficient documentation

## 2014-11-15 HISTORY — PX: CARPAL TUNNEL RELEASE: SHX101

## 2014-11-15 SURGERY — CARPAL TUNNEL RELEASE
Anesthesia: Monitor Anesthesia Care | Site: Wrist | Laterality: Left

## 2014-11-15 MED ORDER — SCOPOLAMINE 1 MG/3DAYS TD PT72: 1.0000 | MEDICATED_PATCH | Freq: Once | TRANSDERMAL | Status: DC | PRN

## 2014-11-15 MED ORDER — HYDROMORPHONE HCL 1 MG/ML IJ SOLN
INTRAMUSCULAR | Status: AC
  Filled 2014-11-15: qty 1

## 2014-11-15 MED ORDER — OXYCODONE HCL 5 MG PO TABS
ORAL_TABLET | ORAL | Status: AC
  Filled 2014-11-15: qty 1

## 2014-11-15 MED ORDER — LACTATED RINGERS IV SOLN
INTRAVENOUS | Status: DC
  Administered 2014-11-15: 10 mL/h via INTRAVENOUS

## 2014-11-15 MED ORDER — OXYCODONE HCL 5 MG/5ML PO SOLN: 5.0000 mg | Freq: Once | ORAL | Status: AC | PRN

## 2014-11-15 MED ORDER — OXYCODONE HCL 5 MG PO TABS
5.0000 mg | ORAL_TABLET | Freq: Once | ORAL | Status: AC | PRN
  Administered 2014-11-15: 5 mg via ORAL

## 2014-11-15 MED ORDER — MIDAZOLAM HCL 2 MG/2ML IJ SOLN
1.0000 mg | INTRAMUSCULAR | Status: DC | PRN
  Administered 2014-11-15: 2 mg via INTRAVENOUS

## 2014-11-15 MED ORDER — MEPERIDINE HCL 25 MG/ML IJ SOLN: 6.2500 mg | INTRAMUSCULAR | Status: DC | PRN

## 2014-11-15 MED ORDER — PROPOFOL 10 MG/ML IV BOLUS
INTRAVENOUS | Status: DC | PRN
  Administered 2014-11-15: 200 mg via INTRAVENOUS

## 2014-11-15 MED ORDER — ONDANSETRON HCL 4 MG/2ML IJ SOLN
INTRAMUSCULAR | Status: AC
  Filled 2014-11-15: qty 2

## 2014-11-15 MED ORDER — FENTANYL CITRATE (PF) 100 MCG/2ML IJ SOLN
50.0000 ug | INTRAMUSCULAR | Status: DC | PRN
  Administered 2014-11-15: 100 ug via INTRAVENOUS

## 2014-11-15 MED ORDER — LIDOCAINE HCL (CARDIAC) 20 MG/ML IV SOLN
INTRAVENOUS | Status: DC | PRN
  Administered 2014-11-15: 80 mg via INTRAVENOUS

## 2014-11-15 MED ORDER — CHLORHEXIDINE GLUCONATE 4 % EX LIQD: 60.0000 mL | Freq: Once | CUTANEOUS | Status: DC

## 2014-11-15 MED ORDER — GLYCOPYRROLATE 0.2 MG/ML IJ SOLN: 0.2000 mg | Freq: Once | INTRAMUSCULAR | Status: DC | PRN

## 2014-11-15 MED ORDER — MIDAZOLAM HCL 2 MG/2ML IJ SOLN
INTRAMUSCULAR | Status: AC
Start: 2014-11-15 — End: 2014-11-15
  Filled 2014-11-15: qty 4

## 2014-11-15 MED ORDER — CEFAZOLIN SODIUM 10 G IJ SOLR
3.0000 g | INTRAMUSCULAR | Status: AC
  Administered 2014-11-15: 2 g via INTRAVENOUS

## 2014-11-15 MED ORDER — DEXAMETHASONE SODIUM PHOSPHATE 4 MG/ML IJ SOLN
INTRAMUSCULAR | Status: DC | PRN
  Administered 2014-11-15: 10 mg via INTRAVENOUS

## 2014-11-15 MED ORDER — CEFAZOLIN SODIUM-DEXTROSE 2-3 GM-% IV SOLR: 2.0000 g | INTRAVENOUS | Status: DC

## 2014-11-15 MED ORDER — CEFAZOLIN SODIUM-DEXTROSE 2-3 GM-% IV SOLR
INTRAVENOUS | Status: AC
  Filled 2014-11-15: qty 50

## 2014-11-15 MED ORDER — BUPIVACAINE HCL (PF) 0.25 % IJ SOLN
INTRAMUSCULAR | Status: DC | PRN
  Administered 2014-11-15: 9 mL

## 2014-11-15 MED ORDER — HYDROMORPHONE HCL 1 MG/ML IJ SOLN
0.2500 mg | INTRAMUSCULAR | Status: DC | PRN
  Administered 2014-11-15: 0.25 mg via INTRAVENOUS
  Administered 2014-11-15: 0.5 mg via INTRAVENOUS

## 2014-11-15 MED ORDER — FENTANYL CITRATE (PF) 100 MCG/2ML IJ SOLN
INTRAMUSCULAR | Status: AC
  Filled 2014-11-15: qty 4

## 2014-11-15 MED ORDER — HYDROCODONE-ACETAMINOPHEN 5-325 MG PO TABS: 1.0000 | ORAL_TABLET | Freq: Four times a day (QID) | ORAL | Status: DC | PRN

## 2014-11-15 SURGICAL SUPPLY — 36 items
BLADE SURG 15 STRL LF DISP TIS (BLADE) ×1 IMPLANT
BLADE SURG 15 STRL SS (BLADE) ×3
BNDG CMPR 9X4 STRL LF SNTH (GAUZE/BANDAGES/DRESSINGS) ×1
BNDG COHESIVE 3X5 TAN STRL LF (GAUZE/BANDAGES/DRESSINGS) ×3 IMPLANT
BNDG ESMARK 4X9 LF (GAUZE/BANDAGES/DRESSINGS) ×2 IMPLANT
BNDG GAUZE ELAST 4 BULKY (GAUZE/BANDAGES/DRESSINGS) ×3 IMPLANT
CHLORAPREP W/TINT 26ML (MISCELLANEOUS) ×3 IMPLANT
CORDS BIPOLAR (ELECTRODE) ×3 IMPLANT
COVER BACK TABLE 60X90IN (DRAPES) ×3 IMPLANT
COVER MAYO STAND STRL (DRAPES) ×3 IMPLANT
CUFF TOURNIQUET SINGLE 18IN (TOURNIQUET CUFF) ×3 IMPLANT
DRAPE EXTREMITY T 121X128X90 (DRAPE) ×3 IMPLANT
DRAPE SURG 17X23 STRL (DRAPES) ×3 IMPLANT
DRSG PAD ABDOMINAL 8X10 ST (GAUZE/BANDAGES/DRESSINGS) ×3 IMPLANT
GAUZE SPONGE 4X4 12PLY STRL (GAUZE/BANDAGES/DRESSINGS) ×3 IMPLANT
GAUZE XEROFORM 1X8 LF (GAUZE/BANDAGES/DRESSINGS) ×3 IMPLANT
GLOVE BIOGEL PI IND STRL 7.0 (GLOVE) IMPLANT
GLOVE BIOGEL PI IND STRL 8.5 (GLOVE) ×1 IMPLANT
GLOVE BIOGEL PI INDICATOR 7.0 (GLOVE) ×4
GLOVE BIOGEL PI INDICATOR 8.5 (GLOVE) ×2
GLOVE ECLIPSE 6.5 STRL STRAW (GLOVE) ×2 IMPLANT
GLOVE SURG ORTHO 8.0 STRL STRW (GLOVE) ×3 IMPLANT
GOWN STRL REUS W/ TWL LRG LVL3 (GOWN DISPOSABLE) ×1 IMPLANT
GOWN STRL REUS W/TWL LRG LVL3 (GOWN DISPOSABLE) ×3
GOWN STRL REUS W/TWL XL LVL3 (GOWN DISPOSABLE) ×3 IMPLANT
NDL PRECISIONGLIDE 27X1.5 (NEEDLE) IMPLANT
NEEDLE PRECISIONGLIDE 27X1.5 (NEEDLE) IMPLANT
NS IRRIG 1000ML POUR BTL (IV SOLUTION) ×3 IMPLANT
PACK BASIN DAY SURGERY FS (CUSTOM PROCEDURE TRAY) ×3 IMPLANT
STOCKINETTE 4X48 STRL (DRAPES) ×3 IMPLANT
SUT ETHILON 4 0 PS 2 18 (SUTURE) ×3 IMPLANT
SUT VICRYL 4-0 PS2 18IN ABS (SUTURE) IMPLANT
SYR BULB 3OZ (MISCELLANEOUS) ×3 IMPLANT
SYR CONTROL 10ML LL (SYRINGE) ×2 IMPLANT
TOWEL OR 17X24 6PK STRL BLUE (TOWEL DISPOSABLE) ×3 IMPLANT
UNDERPAD 30X30 (UNDERPADS AND DIAPERS) ×3 IMPLANT

## 2014-11-15 NOTE — Anesthesia Postprocedure Evaluation (Signed)
  Anesthesia Post-op Note  Patient: [name]  Procedure(s) Performed: Procedure(s): LEFT CARPAL TUNNEL RELEASE (Left)  Patient Location: PACU  Anesthesia Type: MAC, Bier Block   Level of Consciousness: awake, alert  and oriented  Airway and Oxygen Therapy: Patient Spontanous Breathing  Post-op Pain: mild  Post-op Assessment: Post-op Vital signs reviewed  Post-op Vital Signs: Reviewed  Last Vitals:  Filed Vitals:   11/15/14 1029  BP: 133/68  Pulse: 86  Temp: 36.9 C  Resp: 16    Complications: No apparent anesthesia complications

## 2014-11-15 NOTE — Op Note (Signed)
Dictation Number [id number]

## 2014-11-15 NOTE — Transfer of Care (Signed)
Immediate Anesthesia Transfer of Care Note  Patient: [name]  Procedure(s) Performed: Procedure(s): LEFT CARPAL TUNNEL RELEASE (Left)  Patient Location: PACU  Anesthesia Type:General  Level of Consciousness: awake, sedated and patient cooperative  Airway & Oxygen Therapy: Patient Spontanous Breathing and Patient connected to face mask oxygen  Post-op Assessment: Report given to RN and Post -op Vital signs reviewed and stable  Post vital signs: Reviewed and stable  Last Vitals:  Filed Vitals:   11/15/14 0801  BP: 141/77  Pulse: 78  Temp: 36.8 C  Resp: 20    Complications: No apparent anesthesia complications

## 2014-11-15 NOTE — Anesthesia Preprocedure Evaluation (Signed)
Anesthesia Evaluation  Patient identified by MRN, date of birth, ID band Patient awake    Reviewed: Allergy & Precautions, NPO status , Patient's Chart, lab work & pertinent test results  Airway Mallampati: I  TM Distance: >3 FB Neck ROM: Full    Dental  (+) Teeth Intact, Dental Advisory Given   Pulmonary    breath sounds clear to auscultation       Cardiovascular  Rhythm:Regular Rate:Normal     Neuro/Psych    GI/Hepatic   Endo/Other  Morbid obesity  Renal/GU      Musculoskeletal   Abdominal   Peds  Hematology   Anesthesia Other Findings   Reproductive/Obstetrics                             Anesthesia Physical Anesthesia Plan  ASA: II  Anesthesia Plan: MAC and Bier Block   Post-op Pain Management:    Induction: Intravenous  Airway Management Planned: Simple Face Mask  Additional Equipment:   Intra-op Plan:   Post-operative Plan:   Informed Consent: I have reviewed the patients History and Physical, chart, labs and discussed the procedure including the risks, benefits and alternatives for the proposed anesthesia with the patient or authorized representative who has indicated his/her understanding and acceptance.   Dental advisory given  Plan Discussed with: CRNA, Anesthesiologist and Surgeon  Anesthesia Plan Comments:         Anesthesia Quick Evaluation

## 2014-11-15 NOTE — Anesthesia Procedure Notes (Signed)
Procedure Name: LMA Insertion Date/Time: 11/15/2014 10:03 AM Performed by: Lyndee Leo Pre-anesthesia Checklist: Patient identified, Emergency Drugs available, Suction available and Patient being monitored Patient Re-evaluated:Patient Re-evaluated prior to inductionOxygen Delivery Method: Circle System Utilized Preoxygenation: Pre-oxygenation with 100% oxygen Intubation Type: IV induction Ventilation: Mask ventilation without difficulty LMA: LMA with gastric port inserted LMA Size: 4.0 Number of attempts: 1 Placement Confirmation: positive ETCO2 Tube secured with: Tape Dental Injury: Teeth and Oropharynx as per pre-operative assessment

## 2014-11-15 NOTE — H&P (Signed)
[name] is a 59 year old right hand dominant female who comes in complaining of bilateral hand numbness and tingling for the past 5-10 years increasing over the past 3 especially of using braces. She is also complaining of occasional discomfort in her CMC joints bilaterally, right much greater than left. She has been wearing night splints. She has taken Aleve for her knee. She has no history of injury to the hand or neck. She is awakened variably at night. She states writing increases it and driving a car increases the numbness and tingling. She relates this primarily to the median nerve distribution. She complains of intermittent severe tingling pain. Activity and exercise makes this worse, rest helps. There is no history of diabetes or gout. She has a history of thyroid problems and arthritis. There is a family history of diabetes and arthritis. She has had her nerve conductions done by Dr. Zebedee Iba revealing carpal tunnel syndrome bilaterally with motor delays of 5.3 on the left, 5.6 on the right, sensory delay of 3.5 on the left and 3.6 on the right, amplitude diminution to 17 on the left and 16 on the right. We have discussed the implications with herShe is status post release of her right carpal tunnel done on 10/04/14.    PAST MEDICAL HISTORY:  She has no drug allergies. She is on omeprazole, Naprosyn, aspirin, and Paroxetine. She has had oral surgery for wisdom teeth.  FAMILY MEDICAL HISTORY: Positive for diabetes, heart disease and arthritis.  SOCIAL HISTORY:  She does not smoke or use alcohol. She is divorced and widowed. She is an Tourist information centre manager.  REVIEW OF SYSTEMS: Positive for glasses and pneumonia, otherwise negative 14 points.  [name] is an 59 y.o. female.   Chief Complaint: CTS left HPI: see above  Past Medical History  Diagnosis Date  . Arthritis     knees, thumbs  . Plantar fasciitis of right foot 09/2014  . GERD (gastroesophageal reflux disease)   . History of kidney stones  07/2014  . Anxiety   . Sinus problem   . Carpal tunnel syndrome of right wrist 09/2014    Past Surgical History  Procedure Laterality Date  . Cervical conization w/bx  04/2014  . Dilation and curettage of uterus  04/2014  . Wisdom tooth extraction      as a teenager  . Hymenectomy    . Carpal tunnel release Right 10/04/2014    Procedure: RIGHT CARPAL TUNNEL RELEASE;  Surgeon: Daryll Brod, MD;  Location: [facility];  Service: Orthopedics;  Laterality: Right;  REG/FAB    History reviewed. No pertinent family history. Social History:  reports that she has never smoked. She has never used smokeless tobacco. She reports that she does not drink alcohol or use illicit drugs.  Allergies: No Known Allergies  Medications Prior to Admission  Medication Sig Dispense Refill  . aspirin 81 MG tablet Take 81 mg by mouth daily.    . naproxen sodium (ANAPROX) 220 MG tablet Take 220 mg by mouth 2 (two) times daily with a meal.    . omeprazole (PRILOSEC) 20 MG capsule Take 20 mg by mouth daily.    Marland Kitchen PARoxetine (PAXIL) 10 MG tablet Take 10 mg by mouth daily.    Marland Kitchen HYDROcodone-acetaminophen (NORCO) 5-325 MG per tablet Take 1 tablet by mouth every 6 (six) hours as needed for moderate pain. 30 tablet 0    No results found for this or any previous visit (from the past 48 hour(s)).  No results found.   Pertinent items are noted in HPI.  Blood pressure 141/77, pulse 78, temperature 98.2 F (36.8 C), temperature source Oral, resp. rate 20, height 5\' 2"  (1.575 m), weight 126.372 kg (278 lb 9.6 oz), SpO2 99 %.  General appearance: alert, cooperative and appears stated age Head: Normocephalic, without obvious abnormality Neck: no JVD Resp: clear to auscultation bilaterally Cardio: regular rate and rhythm, S1, S2 normal, no murmur, click, rub or gallop GI: soft, non-tender; bowel sounds normal; no masses,  no organomegaly Extremities: numbness left hand Pulses: 2+ and symmetric Skin: Skin  color, texture, turgor normal. 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   DIAGNOSIS: (1)  bilateral carpal tunnel syndrome. (2) Possible double crush.  PLAN: She would like to proceed to have her left side treated for carpal tunnel release.  She has positive nerve conductions on that side at 5.3 and 3.5, motor and sensory components with an amplitude of 17.  She is scheduled for left carpal tunnel release as an outpatient under regional anesthesia.  The pre, peri and postoperative course have been discussed.  She is aware there is no guarantee with the surgery, the possibility of infection, recurrence, injury to arteries, nerves, tendons, incomplete relief of symptoms and dystrophy.   [name] R 11/15/2014, 9:39 AM

## 2014-11-15 NOTE — Discharge Instructions (Addendum)

## 2014-11-15 NOTE — Brief Op Note (Signed)
11/15/2014  10:31 AM  PATIENT:  [name]  59 y.o. female  PRE-OPERATIVE DIAGNOSIS:  CARPAL TUNNEL SYNDROME LEFT   POST-OPERATIVE DIAGNOSIS:  CARPAL TUNNEL SYNDROME LEFT   PROCEDURE:  Procedure(s): LEFT CARPAL TUNNEL RELEASE (Left)  SURGEON:  Surgeon(s) and Role:    * Daryll Brod, MD - Primary  PHYSICIAN ASSISTANT:   ASSISTANTS: none   ANESTHESIA:   local and general  EBL:  Total I/O In: 750 [I.V.:750] Out: -   BLOOD ADMINISTERED:none  DRAINS: none   LOCAL MEDICATIONS USED:  BUPIVICAINE   SPECIMEN:  No Specimen  DISPOSITION OF SPECIMEN:  N/A  COUNTS:  YES  TOURNIQUET:   Total Tourniquet Time Documented: Upper Arm (Left) - 10 minutes Total: Upper Arm (Left) - 10 minutes   DICTATION: .Other Dictation: Dictation Number [id number]  PLAN OF CARE: Discharge to home after PACU  PATIENT DISPOSITION:  PACU - hemodynamically stable.

## 2014-11-16 ENCOUNTER — Encounter (HOSPITAL_BASED_OUTPATIENT_CLINIC_OR_DEPARTMENT_OTHER): Payer: Self-pay | Admitting: Orthopedic Surgery

## 2014-11-16 NOTE — Op Note (Signed)
[name], [name]                 ACCOUNT NO.:  [id number]  MEDICAL RECORD NO.:  [id number]  LOCATION:                                 FACILITY:  PHYSICIAN:  Daryll Brod, M.D.            DATE OF BIRTH:  DATE OF PROCEDURE:  11/15/2014 DATE OF DISCHARGE:                              OPERATIVE REPORT   PREOPERATIVE DIAGNOSIS:  Carpal tunnel syndrome, left hand.  POSTOPERATIVE DIAGNOSIS:  Carpal tunnel syndrome, left hand.  OPERATION:  Decompression of left median nerve.  SURGEON:  Daryll Brod, MD  ANESTHESIA:  General with local infiltration.  ANESTHESIOLOGIST:  Lorrene Reid, M.D.  HISTORY:  The patient is a 59 year old female with a history of carpal tunnel syndrome.  Nerve conductions are positive.  This has not responded to conservative treatment.  She has elected to undergo carpal tunnel release.  Pre, peri, and postoperative course have been discussed along with risks and complications.  She is aware there is no guarantee with surgery; possibility of infection; recurrence of injury to arteries, nerves, tendons; incomplete relief of symptoms; dystrophy.  In the preoperative area, the patient was seen, the extremity marked by both patient and surgeon.  Antibiotic given.  PROCEDURE IN DETAIL:  The patient was brought to the operating room, where general anesthetic was carried out without difficulty.  She was prepped using ChloraPrep, supine position, left arm free.  A 3-minute dry time was allowed.  Time-out taken, confirming the patient and procedure.  The limb was exsanguinated with an Esmarch bandage. Tourniquet placed high and the arm was inflated to 250 mmHg.  A longitudinal incision was made in the left palm, carried down through subcutaneous tissue.  Bleeders were electrocauterized with bipolar. Palmar fascia was split.  The superficial palmar arch was identified. The flexor tendon to the ring and little finger was identified. Retractors placed protecting  neurovascular bundles.  The carpal retinaculum was then incised to the ulnar side of the median nerve.  A right angle and Sewell retractor were placed between skin and forearm fascia.  The fascia was then released for approximately 3 cm proximal to the wrist crease under direct vision.  The canal was explored.  Area of compression to the nerve was apparent.  Motor branch entered into muscle distally.  No further lesions were identified.  The wound was copiously irrigated with saline.  The skin was then closed with interrupted 4-0 nylon sutures.  Local infiltration with 0.25% bupivacaine without epinephrine was given, 9 mL was used.  A sterile compressive dressing was applied.  On deflation of the tourniquet, all fingers immediately pinked.  She was taken to the recovery room for observation in satisfactory condition.  She will be discharged home to return to [place] in 1 week on [place].          ______________________________ Daryll Brod, M.D.     GK/MEDQ  D:  11/15/2014  T:  11/16/2014  Job:  [id number]

## 2014-11-18 NOTE — Addendum Note (Signed)
Addendum  created 11/18/14 0757 by Lorrene Reid, MD   Modules edited: Clinical Notes   Clinical Notes:  File: [id number]

## 2014-11-30 HISTORY — PX: CHOLECYSTECTOMY: SHX55

## 2014-12-14 DIAGNOSIS — G5602 Carpal tunnel syndrome, left upper limb: Secondary | ICD-10-CM | POA: Insufficient documentation

## 2015-08-15 HISTORY — PX: OTHER SURGICAL HISTORY: SHX169

## 2018-06-03 HISTORY — PX: OTHER SURGICAL HISTORY: SHX169

## 2018-11-27 DIAGNOSIS — K8020 Calculus of gallbladder without cholecystitis without obstruction: Secondary | ICD-10-CM | POA: Insufficient documentation

## 2019-07-13 ENCOUNTER — Encounter: Payer: Self-pay | Admitting: *Deleted

## 2019-07-20 ENCOUNTER — Other Ambulatory Visit: Payer: Self-pay

## 2019-07-20 ENCOUNTER — Inpatient Hospital Stay: Payer: BC Managed Care – PPO | Attending: Gynecologic Oncology | Admitting: Gynecologic Oncology

## 2019-07-20 ENCOUNTER — Encounter: Payer: Self-pay | Admitting: Gynecologic Oncology

## 2019-07-20 VITALS — BP 148/72 | HR 81 | Temp 98.9°F | Resp 17 | Ht 62.0 in | Wt 263.6 lb

## 2019-07-20 DIAGNOSIS — Z6841 Body Mass Index (BMI) 40.0 and over, adult: Secondary | ICD-10-CM | POA: Diagnosis not present

## 2019-07-20 DIAGNOSIS — E079 Disorder of thyroid, unspecified: Secondary | ICD-10-CM | POA: Insufficient documentation

## 2019-07-20 DIAGNOSIS — N950 Postmenopausal bleeding: Secondary | ICD-10-CM | POA: Diagnosis not present

## 2019-07-20 DIAGNOSIS — M1990 Unspecified osteoarthritis, unspecified site: Secondary | ICD-10-CM | POA: Diagnosis not present

## 2019-07-20 DIAGNOSIS — Z7982 Long term (current) use of aspirin: Secondary | ICD-10-CM | POA: Insufficient documentation

## 2019-07-20 DIAGNOSIS — K219 Gastro-esophageal reflux disease without esophagitis: Secondary | ICD-10-CM | POA: Diagnosis not present

## 2019-07-20 DIAGNOSIS — F419 Anxiety disorder, unspecified: Secondary | ICD-10-CM | POA: Diagnosis not present

## 2019-07-20 DIAGNOSIS — N8502 Endometrial intraepithelial neoplasia [EIN]: Secondary | ICD-10-CM | POA: Diagnosis present

## 2019-07-20 DIAGNOSIS — Z79899 Other long term (current) drug therapy: Secondary | ICD-10-CM | POA: Insufficient documentation

## 2019-07-20 NOTE — Progress Notes (Signed)
Consult Note: Gyn-Onc  Consult was requested by Dr. Rosario Adie for the evaluation of [name] 64 y.o. female  CC:  Chief Complaint  Patient presents with  . Endometrial hyperplasia with atypia    New Patient    Assessment/Plan:  [name]  is a 64 y.o.  year old with a history of postmenopausal bleeding, endometrial hyperplasia and morbid obesity (BMI 48kg/m2).   We will follow-up the results of today's biopsy.  If this shows hyperplasia or malignancy she will be considered for hysterectomy.  Alternatively repeat IUD placement could be considered.  If this shows benign pathology I will schedule her for [facility] and replacement of the Mirena IUD.  She will need to stop aspirin if we are planning on operative procedure other than a simple D&C.  HPI: Ms [name] is a 64 year old P0 who was seen in consultation at the request of Dr. Rosario Adie for history of complex atypical endometrial hyperplasia and postmenopausal bleeding.  The patient had postmenopausal bleeding and an AGUS Pap smear in 2016.  This was worked up with a hysteroscopy D&C, cone biopsy, which revealed atypical endometrial hyperplasia no cervical pathology.  Her BMI at that time was 55 kg per metered squared and she was felt to not be a good candidate for hysterectomy, and therefore a Mirena IUD was placed.  A biopsy was performed in 2017 for benign pathology at that time the endometrial sampling caused removal of the IUD and therefore it was replaced in 2017.  That sampling was benign.  Repeat endometrial sampling with hysteroscopy D&C was performed on Jun 03, 2018 due to postmenopausal bleeding and revealed no hyperplasia or malignancy.  It is unclear if the IUD was present at that examination.  A Pap test on July 06, 2019 was normal cytologically.  The patient developed her first episode of postmenopausal bleeding (since May 2020) on July 18, 2019.  Her medical history is significant for morbid obesity, thyroid  disease, kidney stones, and reflux.  She denies a history of diabetes mellitus.  She takes aspirin 81 mg for general heart health.  Her surgical history is remarkable for a laparoscopic cholecystectomy in 2021.  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  She has no significant family history for malignancy with only a maternal aunt having history of breast cancer.   Current Meds:  Outpatient Encounter Medications as of 07/20/2019  Medication Sig  . acetaminophen (TYLENOL) 500 MG tablet Take by mouth.  Marland Kitchen aspirin 81 MG tablet Take 81 mg by mouth daily.  . naproxen sodium (ANAPROX) 220 MG tablet Take 220 mg by mouth 2 (two) times daily with a meal.  . omeprazole (PRILOSEC) 20 MG capsule Take 20 mg by mouth daily.  . [DISCONTINUED] ciprofloxacin (CIPRO) 500 MG tablet Take by mouth. (Patient not taking: Reported on 07/20/2019)  . [DISCONTINUED] HYDROcodone-acetaminophen (NORCO) 5-325 MG per tablet Take 1 tablet by mouth every 6 (six) hours as needed for moderate pain. (Patient not taking: Reported on 07/20/2019)  . [DISCONTINUED] HYDROcodone-acetaminophen (NORCO) 5-325 MG tablet Take 1 tablet by mouth every 6 (six) hours as needed for moderate pain. (Patient not taking: Reported on 07/20/2019)  . [DISCONTINUED] PARoxetine (PAXIL) 10 MG tablet Take 10 mg by mouth daily. (Patient not taking: Reported on 07/20/2019)   No facility-administered encounter medications on file as of 07/20/2019.    Allergy: No Known Allergies  Social Hx:   Social History   Socioeconomic History  . Marital status: Widowed  Spouse name: Not on file  . Number of children: Not on file  . Years of education: Not on file  . Highest education level: Not on file  Occupational History  . Not on file  Tobacco Use  . Smoking status: Never Smoker  . Smokeless tobacco: Never Used  Vaping Use  . Vaping Use: Never used  Substance and Sexual Activity  . Alcohol use: No  . Drug use: No  . Sexual activity: Not on file   Other Topics Concern  . Not on file  Social History Narrative  . Not on file   Social Determinants of Health   Financial Resource Strain:   . Difficulty of Paying Living Expenses:   Food Insecurity:   . Worried About Charity fundraiser in the Last Year:   . Arboriculturist in the Last Year:   Transportation Needs:   . Film/video editor (Medical):   Marland Kitchen Lack of Transportation (Non-Medical):   Physical Activity:   . Days of Exercise per Week:   . Minutes of Exercise per Session:   Stress:   . Feeling of Stress :   Social Connections:   . Frequency of Communication with Friends and Family:   . Frequency of Social Gatherings with Friends and Family:   . Attends Religious Services:   . Active Member of Clubs or Organizations:   . Attends Archivist Meetings:   Marland Kitchen Marital Status:   Intimate Partner Violence:   . Fear of Current or Ex-Partner:   . Emotionally Abused:   Marland Kitchen Physically Abused:   . Sexually Abused:     Past Surgical Hx:  Past Surgical History:  Procedure Laterality Date  . CARPAL TUNNEL RELEASE Right 10/04/2014   Procedure: RIGHT CARPAL TUNNEL RELEASE;  Surgeon: Daryll Brod, MD;  Location: [place];  Service: Orthopedics;  Laterality: Right;  REG/FAB  . CARPAL TUNNEL RELEASE Left 11/15/2014   Procedure: LEFT CARPAL TUNNEL RELEASE;  Surgeon: Daryll Brod, MD;  Location: [place];  Service: Orthopedics;  Laterality: Left;  . CERVICAL CONIZATION W/BX  04/2014  . CHOLECYSTECTOMY    . DILATION AND CURETTAGE OF UTERUS  04/2014  . HYMENECTOMY    . hysteroscoptic insertion of mirena IUD    . HYSTEROSCOPY    . VARICOSE VEIN SURGERY    . WISDOM TOOTH EXTRACTION     as a teenager    Past Medical Hx:  Past Medical History:  Diagnosis Date  . Anxiety   . Arthritis    knees, thumbs  . 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 GERD (gastroesophageal reflux disease)   . History of kidney stones 07/2014  . Plantar  fasciitis of right foot 09/2014  . Sinus problem     Past Gynecological History:  See HPI No LMP recorded. Patient is postmenopausal.  Family Hx:  Family History  Problem Relation Age of Onset  . Diabetes Mother   . Stroke Mother   . Heart disease Father   . Cancer Maternal Aunt        Breast    Review of Systems:  Constitutional  Feels well,   ENT Normal appearing ears and nares bilaterally Skin/Breast  No rash, sores, jaundice, itching, dryness Cardiovascular  No chest pain, shortness of breath, or edema  Pulmonary  No cough or wheeze.  Gastro Intestinal  No nausea, vomitting, or diarrhoea. No bright red blood per rectum, no abdominal pain, change in bowel  movement, or constipation.  Genito Urinary  No frequency, urgency, dysuria, + postmenopausal bleeding.  Musculo Skeletal  No myalgia, arthralgia, joint swelling or pain  Neurologic  No weakness, numbness, change in gait,  Psychology  No depression, anxiety, insomnia.   Vitals:  Blood pressure (!) 148/72, pulse 81, temperature 98.9 F (37.2 C), temperature source Oral, resp. rate 17, height 5\' 2"  (1.575 m), weight 263 lb 9.6 oz (119.6 kg), SpO2 99 %.  Physical Exam: WD in NAD Neck  Supple NROM, without any enlargements.  Lymph Node Survey No cervical supraclavicular or inguinal adenopathy Cardiovascular  Pulse normal rate, regularity and rhythm. S1 and S2 normal.  Lungs  Clear to auscultation bilateraly, without wheezes/crackles/rhonchi. Good air movement.  Skin  No rash/lesions/breakdown  Psychiatry  Alert and oriented to person, place, and time  Abdomen  Normoactive bowel sounds, abdomen soft, non-tender and obese without evidence of hernia.  Back No CVA tenderness Genito Urinary  Vulva/vagina: Normal external female genitalia.   No lesions. No discharge or bleeding.  Bladder/urethra:  No lesions or masses, well supported bladder  Vagina: grossly normal  Cervix: Normal appearing, no  lesions.  Uterus:  Small, mobile, no parametrial involvement or nodularity.  Adnexa: no palpable masses. Rectal  deferred Extremities  No bilateral cyanosis, clubbing or edema.  Procedure Note:  Preop Dx: postmenopausal bleeding Postop Dx: same Procedure: endometrial biospy Surgeon: Dorann Ou, MD EBL: 10cc Specimens: endometrium Complications: none Procedure Details: Patient provided verbal consent and verbal timeout was performed.  The speculum was inserted to the vagina and the speculum was manipulated to visualize the cervix.  No IUD strings were visualized.  The cervix was grasped with single-tooth tenaculum.  A os finder was used to penetrate the cervical awes.  The Pipelle was inserted to an 8 cm depth and aspirated for a small amount of tissue.  This was placed in a specimen cup and sent for histopathology.  The tenaculum was removed.  There is brisk bleeding from the tenaculum sites which was made hemostatic with Monsel solution.  The patient tolerated procedure well.    [name], MD  07/20/2019, 11:26 AM

## 2019-07-20 NOTE — H&P (View-Only) (Signed)
Consult Note: Gyn-Onc  Consult was requested by Dr. Rosario Adie for the evaluation of [name] 64 y.o. female  CC:  Chief Complaint  Patient presents with  . Endometrial hyperplasia with atypia    New Patient    Assessment/Plan:  [name]  is a 64 y.o.  year old with a history of postmenopausal bleeding, endometrial hyperplasia and morbid obesity (BMI 48kg/m2).   We will follow-up the results of today's biopsy.  If this shows hyperplasia or malignancy she will be considered for hysterectomy.  Alternatively repeat IUD placement could be considered.  If this shows benign pathology I will schedule her for [facility] and replacement of the Mirena IUD.  She will need to stop aspirin if we are planning on operative procedure other than a simple D&C.  HPI: [name] is a 64 year old P0 who was seen in consultation at the request of Dr. Rosario Adie for history of complex atypical endometrial hyperplasia and postmenopausal bleeding.  The patient had postmenopausal bleeding and an AGUS Pap smear in 2016.  This was worked up with a hysteroscopy D&C, cone biopsy, which revealed atypical endometrial hyperplasia no cervical pathology.  Her BMI at that time was 55 kg per metered squared and she was felt to not be a good candidate for hysterectomy, and therefore a Mirena IUD was placed.  A biopsy was performed in 2017 for benign pathology at that time the endometrial sampling caused removal of the IUD and therefore it was replaced in 2017.  That sampling was benign.  Repeat endometrial sampling with hysteroscopy D&C was performed on Jun 03, 2018 due to postmenopausal bleeding and revealed no hyperplasia or malignancy.  It is unclear if the IUD was present at that examination.  A Pap test on July 06, 2019 was normal cytologically.  The patient developed her first episode of postmenopausal bleeding (since May 2020) on July 18, 2019.  Her medical history is significant for morbid obesity, thyroid  disease, kidney stones, and reflux.  She denies a history of diabetes mellitus.  She takes aspirin 81 mg for general heart health.  Her surgical history is remarkable for a laparoscopic cholecystectomy in 2021.  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  She has no significant family history for malignancy with only a maternal aunt having history of breast cancer.   Current Meds:  Outpatient Encounter Medications as of 07/20/2019  Medication Sig  . acetaminophen (TYLENOL) 500 MG tablet Take by mouth.  Marland Kitchen aspirin 81 MG tablet Take 81 mg by mouth daily.  . naproxen sodium (ANAPROX) 220 MG tablet Take 220 mg by mouth 2 (two) times daily with a meal.  . omeprazole (PRILOSEC) 20 MG capsule Take 20 mg by mouth daily.  . [DISCONTINUED] ciprofloxacin (CIPRO) 500 MG tablet Take by mouth. (Patient not taking: Reported on 07/20/2019)  . [DISCONTINUED] HYDROcodone-acetaminophen (NORCO) 5-325 MG per tablet Take 1 tablet by mouth every 6 (six) hours as needed for moderate pain. (Patient not taking: Reported on 07/20/2019)  . [DISCONTINUED] HYDROcodone-acetaminophen (NORCO) 5-325 MG tablet Take 1 tablet by mouth every 6 (six) hours as needed for moderate pain. (Patient not taking: Reported on 07/20/2019)  . [DISCONTINUED] PARoxetine (PAXIL) 10 MG tablet Take 10 mg by mouth daily. (Patient not taking: Reported on 07/20/2019)   No facility-administered encounter medications on file as of 07/20/2019.    Allergy: No Known Allergies  Social Hx:   Social History   Socioeconomic History  . Marital status: Widowed  Spouse name: Not on file  . Number of children: Not on file  . Years of education: Not on file  . Highest education level: Not on file  Occupational History  . Not on file  Tobacco Use  . Smoking status: Never Smoker  . Smokeless tobacco: Never Used  Vaping Use  . Vaping Use: Never used  Substance and Sexual Activity  . Alcohol use: No  . Drug use: No  . Sexual activity: Not on file   Other Topics Concern  . Not on file  Social History Narrative  . Not on file   Social Determinants of Health   Financial Resource Strain:   . Difficulty of Paying Living Expenses:   Food Insecurity:   . Worried About Charity fundraiser in the Last Year:   . Arboriculturist in the Last Year:   Transportation Needs:   . Film/video editor (Medical):   Marland Kitchen Lack of Transportation (Non-Medical):   Physical Activity:   . Days of Exercise per Week:   . Minutes of Exercise per Session:   Stress:   . Feeling of Stress :   Social Connections:   . Frequency of Communication with Friends and Family:   . Frequency of Social Gatherings with Friends and Family:   . Attends Religious Services:   . Active Member of Clubs or Organizations:   . Attends Archivist Meetings:   Marland Kitchen Marital Status:   Intimate Partner Violence:   . Fear of Current or Ex-Partner:   . Emotionally Abused:   Marland Kitchen Physically Abused:   . Sexually Abused:     Past Surgical Hx:  Past Surgical History:  Procedure Laterality Date  . CARPAL TUNNEL RELEASE Right 10/04/2014   Procedure: RIGHT CARPAL TUNNEL RELEASE;  Surgeon: Daryll Brod, MD;  Location: [place];  Service: Orthopedics;  Laterality: Right;  REG/FAB  . CARPAL TUNNEL RELEASE Left 11/15/2014   Procedure: LEFT CARPAL TUNNEL RELEASE;  Surgeon: Daryll Brod, MD;  Location: [place];  Service: Orthopedics;  Laterality: Left;  . CERVICAL CONIZATION W/BX  04/2014  . CHOLECYSTECTOMY    . DILATION AND CURETTAGE OF UTERUS  04/2014  . HYMENECTOMY    . hysteroscoptic insertion of mirena IUD    . HYSTEROSCOPY    . VARICOSE VEIN SURGERY    . WISDOM TOOTH EXTRACTION     as a teenager    Past Medical Hx:  Past Medical History:  Diagnosis Date  . Anxiety   . Arthritis    knees, thumbs  . 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 GERD (gastroesophageal reflux disease)   . History of kidney stones 07/2014  . Plantar  fasciitis of right foot 09/2014  . Sinus problem     Past Gynecological History:  See HPI No LMP recorded. Patient is postmenopausal.  Family Hx:  Family History  Problem Relation Age of Onset  . Diabetes Mother   . Stroke Mother   . Heart disease Father   . Cancer Maternal Aunt        Breast    Review of Systems:  Constitutional  Feels well,   ENT Normal appearing ears and nares bilaterally Skin/Breast  No rash, sores, jaundice, itching, dryness Cardiovascular  No chest pain, shortness of breath, or edema  Pulmonary  No cough or wheeze.  Gastro Intestinal  No nausea, vomitting, or diarrhoea. No bright red blood per rectum, no abdominal pain, change in bowel  movement, or constipation.  Genito Urinary  No frequency, urgency, dysuria, + postmenopausal bleeding.  Musculo Skeletal  No myalgia, arthralgia, joint swelling or pain  Neurologic  No weakness, numbness, change in gait,  Psychology  No depression, anxiety, insomnia.   Vitals:  Blood pressure (!) 148/72, pulse 81, temperature 98.9 F (37.2 C), temperature source Oral, resp. rate 17, height 5\' 2"  (1.575 m), weight 263 lb 9.6 oz (119.6 kg), SpO2 99 %.  Physical Exam: WD in NAD Neck  Supple NROM, without any enlargements.  Lymph Node Survey No cervical supraclavicular or inguinal adenopathy Cardiovascular  Pulse normal rate, regularity and rhythm. S1 and S2 normal.  Lungs  Clear to auscultation bilateraly, without wheezes/crackles/rhonchi. Good air movement.  Skin  No rash/lesions/breakdown  Psychiatry  Alert and oriented to person, place, and time  Abdomen  Normoactive bowel sounds, abdomen soft, non-tender and obese without evidence of hernia.  Back No CVA tenderness Genito Urinary  Vulva/vagina: Normal external female genitalia.   No lesions. No discharge or bleeding.  Bladder/urethra:  No lesions or masses, well supported bladder  Vagina: grossly normal  Cervix: Normal appearing, no  lesions.  Uterus:  Small, mobile, no parametrial involvement or nodularity.  Adnexa: no palpable masses. Rectal  deferred Extremities  No bilateral cyanosis, clubbing or edema.  Procedure Note:  Preop Dx: postmenopausal bleeding Postop Dx: same Procedure: endometrial biospy Surgeon: Dorann Ou, MD EBL: 10cc Specimens: endometrium Complications: none Procedure Details: Patient provided verbal consent and verbal timeout was performed.  The speculum was inserted to the vagina and the speculum was manipulated to visualize the cervix.  No IUD strings were visualized.  The cervix was grasped with single-tooth tenaculum.  A os finder was used to penetrate the cervical awes.  The Pipelle was inserted to an 8 cm depth and aspirated for a small amount of tissue.  This was placed in a specimen cup and sent for histopathology.  The tenaculum was removed.  There is brisk bleeding from the tenaculum sites which was made hemostatic with Monsel solution.  The patient tolerated procedure well.    [name], MD  07/20/2019, 11:26 AM

## 2019-07-20 NOTE — Patient Instructions (Signed)
Dr Denman George performed an endometrial biopsy today. She will order an ultrasound of the uterus.  Her office will call you regarding these results. It may be that you require a D&C in the operating room at [facility], with placement of a new IUD.  She will know more once these results are available.  Her office can be reached at [phone].

## 2019-07-22 LAB — SURGICAL PATHOLOGY

## 2019-07-23 ENCOUNTER — Ambulatory Visit (HOSPITAL_COMMUNITY)
Admission: RE | Admit: 2019-07-23 | Discharge: 2019-07-23 | Disposition: A | Discharge status: Home / Self Care | Payer: BC Managed Care – PPO | Source: Ambulatory Visit | Attending: Gynecologic Oncology | Admitting: Gynecologic Oncology

## 2019-07-23 ENCOUNTER — Other Ambulatory Visit: Payer: Self-pay

## 2019-07-23 DIAGNOSIS — N8502 Endometrial intraepithelial neoplasia [EIN]: Secondary | ICD-10-CM | POA: Diagnosis present

## 2019-07-23 DIAGNOSIS — N950 Postmenopausal bleeding: Secondary | ICD-10-CM | POA: Insufficient documentation

## 2019-07-26 ENCOUNTER — Telehealth: Payer: Self-pay

## 2019-07-26 NOTE — Telephone Encounter (Signed)
Told [name] that the biopsy showed no hyperplasia or cancer. The US showed no abnormalities and that the IUD is in place. Dr. Denman George is recommending a D&C and US guidance for replacement of IUD. [name] is agreeable to this plan. She is available the week of July 19th, 26, and August 9th. She is on vacation the wek of August 2nd. She wants to know what the follow up would be after the D&D and IUD placement. Would there be serial biopsy ~3 months etc. Would she be followed by Dr. Denman George or would she need a GYN?  Dr. Barrie Dunker retired.

## 2019-07-27 ENCOUNTER — Other Ambulatory Visit (HOSPITAL_COMMUNITY): Payer: Self-pay | Admitting: Gynecologic Oncology

## 2019-07-27 ENCOUNTER — Other Ambulatory Visit: Payer: Self-pay | Admitting: Gynecologic Oncology

## 2019-07-27 DIAGNOSIS — N950 Postmenopausal bleeding: Secondary | ICD-10-CM

## 2019-07-28 ENCOUNTER — Other Ambulatory Visit: Payer: Self-pay | Admitting: Gynecologic Oncology

## 2019-07-28 DIAGNOSIS — N950 Postmenopausal bleeding: Secondary | ICD-10-CM

## 2019-07-28 NOTE — Telephone Encounter (Signed)
Told [name] that her surgery will be on Tuesday July 27,2021 at the [facility] outpatient surgery Center. She will receive a call from the surgery a few days prior with instructions. Dr. Denman George said that it is fine for her to continue her ASA 81 mg and Aleve 220 mg bid til night prior to surgery.  IIf the pathology from the surgery shows no cancer, she will not need endometrial biopsies unless she has vaginal bleeding.  She would be followed by her regular GYN. Dr. Barrie Dunker has retired.  Suggested she see if another physician is suitable in the practice. Pt verbalized understanding.

## 2019-07-28 NOTE — Progress Notes (Signed)
Intraoperative [place] ordered.

## 2019-07-30 ENCOUNTER — Other Ambulatory Visit: Payer: Self-pay

## 2019-07-30 ENCOUNTER — Encounter (HOSPITAL_BASED_OUTPATIENT_CLINIC_OR_DEPARTMENT_OTHER): Payer: Self-pay | Admitting: Gynecologic Oncology

## 2019-07-30 NOTE — Progress Notes (Signed)
Spoke w/ via phone for pre-op interview---pt  Lab needs dos----  Has lab appt 08-06-19 at 900 am for cbc, bmet             Lab results------none COVID test ------08-06-2019 at 1000 am  Pt aware to bring vaccine card dos for verification. Arrive at -------1215 pm 08-10-2019 NPO after MN NO Solid Food.  Clear liquids from MN until---1115 am then npo Medications to take morning of surgery -----omeprazole Diabetic medication -----n/a Patient Special Instructions -----none Pre-Op special Istructions -----pt instructed by dr Denman George to stay on 81 mg aspirin last dose day before surgery Patient verbalized understanding of instructions that were given at this phone interview. Patient denies shortness of breath, chest pain, fever, cough a this phone interview.

## 2019-08-05 ENCOUNTER — Ambulatory Visit (HOSPITAL_COMMUNITY): Payer: BC Managed Care – PPO

## 2019-08-06 ENCOUNTER — Other Ambulatory Visit (HOSPITAL_COMMUNITY)
Admission: RE | Admit: 2019-08-06 | Discharge: 2019-08-06 | Disposition: A | Discharge status: Home / Self Care | Payer: BC Managed Care – PPO | Source: Ambulatory Visit | Attending: Gynecologic Oncology | Admitting: Gynecologic Oncology

## 2019-08-06 ENCOUNTER — Other Ambulatory Visit: Payer: Self-pay

## 2019-08-06 ENCOUNTER — Encounter (HOSPITAL_COMMUNITY)
Admission: RE | Admit: 2019-08-06 | Discharge: 2019-08-06 | Disposition: A | Discharge status: Home / Self Care | Payer: BC Managed Care – PPO | Source: Ambulatory Visit | Attending: Gynecologic Oncology | Admitting: Gynecologic Oncology

## 2019-08-06 DIAGNOSIS — Z01812 Encounter for preprocedural laboratory examination: Secondary | ICD-10-CM | POA: Insufficient documentation

## 2019-08-06 DIAGNOSIS — Z20822 Contact with and (suspected) exposure to covid-19: Secondary | ICD-10-CM | POA: Diagnosis not present

## 2019-08-06 LAB — BASIC METABOLIC PANEL
Anion gap: 8 (ref 5–15)
BUN: 18 mg/dL (ref 8–23)
CO2: 27 mmol/L (ref 22–32)
Calcium: 9.2 mg/dL (ref 8.9–10.3)
Chloride: 105 mmol/L (ref 98–111)
Creatinine, Ser: 0.74 mg/dL (ref 0.44–1.00)
GFR calc Af Amer: >60 mL/min (ref >60)
GFR calc non Af Amer: >60 mL/min (ref >60)
Glucose, Bld: 96 mg/dL (ref 70–99)
Potassium: 4.3 mmol/L (ref 3.5–5.1)
Sodium: 140 mmol/L (ref 135–145)

## 2019-08-06 LAB — CBC
HCT: 42 % (ref 36.0–46.0)
Hemoglobin: 13.4 g/dL (ref 12.0–15.0)
MCH: 31.4 pg (ref 26.0–34.0)
MCHC: 31.9 g/dL (ref 30.0–36.0)
MCV: 98.4 fL (ref 80.0–100.0)
Platelets: 303 10*3/uL (ref 150–400)
RBC: 4.27 MIL/uL (ref 3.87–5.11)
RDW: 13.2 % (ref 11.5–15.5)
WBC: 7.1 10*3/uL (ref 4.0–10.5)
nRBC: 0 % (ref 0.0–0.2)

## 2019-08-06 LAB — SARS CORONAVIRUS 2 (TAT 6-24 HRS): SARS Coronavirus 2: NEGATIVE

## 2019-08-09 ENCOUNTER — Telehealth: Payer: Self-pay

## 2019-08-09 NOTE — Telephone Encounter (Signed)
Ms Jeschke understands her pre op instruction for tomorrow. Pt may hold her ASA 81 mg  and naprosyn dose for the am of surgery even though it is permissible per Dr. Denman George. Pt concerned about payment of $3000.00 tomorrow that pre service center says she will owe for [place]. Ms Mac spoke with Langley Gauss RN at the out patient surgery center and she stated that the pre service center is not seeing that the [place] is intra operatively and not separate from surgery. Langley Gauss told her not to be concerned about payment.  Pt comfortable with this information.

## 2019-08-10 ENCOUNTER — Ambulatory Visit (HOSPITAL_COMMUNITY)
Admission: RE | Admit: 2019-08-10 | Discharge: 2019-08-10 | Disposition: A | Discharge status: Home / Self Care | Payer: BC Managed Care – PPO | Source: Ambulatory Visit | Attending: Gynecologic Oncology | Admitting: Gynecologic Oncology

## 2019-08-10 ENCOUNTER — Other Ambulatory Visit: Payer: Self-pay

## 2019-08-10 ENCOUNTER — Ambulatory Visit (HOSPITAL_BASED_OUTPATIENT_CLINIC_OR_DEPARTMENT_OTHER)
Admission: RE | Admit: 2019-08-10 | Discharge: 2019-08-10 | Disposition: A | Discharge status: Home / Self Care | Payer: BC Managed Care – PPO | Attending: Gynecologic Oncology | Admitting: Gynecologic Oncology

## 2019-08-10 ENCOUNTER — Ambulatory Visit (HOSPITAL_BASED_OUTPATIENT_CLINIC_OR_DEPARTMENT_OTHER): Payer: BC Managed Care – PPO | Admitting: Anesthesiology

## 2019-08-10 ENCOUNTER — Encounter (HOSPITAL_BASED_OUTPATIENT_CLINIC_OR_DEPARTMENT_OTHER): Payer: Self-pay | Admitting: Gynecologic Oncology

## 2019-08-10 ENCOUNTER — Encounter (HOSPITAL_BASED_OUTPATIENT_CLINIC_OR_DEPARTMENT_OTHER)
Admission: RE | Disposition: A | Discharge status: Home / Self Care | Payer: Self-pay | Source: Home / Self Care | Attending: Gynecologic Oncology

## 2019-08-10 DIAGNOSIS — Z8742 Personal history of other diseases of the female genital tract: Secondary | ICD-10-CM | POA: Diagnosis not present

## 2019-08-10 DIAGNOSIS — K219 Gastro-esophageal reflux disease without esophagitis: Secondary | ICD-10-CM | POA: Diagnosis not present

## 2019-08-10 DIAGNOSIS — M8949 Other hypertrophic osteoarthropathy, multiple sites: Secondary | ICD-10-CM | POA: Diagnosis not present

## 2019-08-10 DIAGNOSIS — Z7982 Long term (current) use of aspirin: Secondary | ICD-10-CM | POA: Insufficient documentation

## 2019-08-10 DIAGNOSIS — N950 Postmenopausal bleeding: Secondary | ICD-10-CM

## 2019-08-10 DIAGNOSIS — Z6841 Body Mass Index (BMI) 40.0 and over, adult: Secondary | ICD-10-CM | POA: Diagnosis not present

## 2019-08-10 DIAGNOSIS — Z30433 Encounter for removal and reinsertion of intrauterine contraceptive device: Secondary | ICD-10-CM | POA: Diagnosis not present

## 2019-08-10 DIAGNOSIS — F419 Anxiety disorder, unspecified: Secondary | ICD-10-CM | POA: Diagnosis not present

## 2019-08-10 DIAGNOSIS — Z79899 Other long term (current) drug therapy: Secondary | ICD-10-CM | POA: Diagnosis not present

## 2019-08-10 HISTORY — PX: DILATION AND CURETTAGE OF UTERUS: SHX78

## 2019-08-10 HISTORY — DX: Plantar fascial fibromatosis: M72.2

## 2019-08-10 HISTORY — PX: OPERATIVE ULTRASOUND: SHX5996

## 2019-08-10 HISTORY — PX: INTRAUTERINE DEVICE (IUD) INSERTION: SHX5877

## 2019-08-10 SURGERY — DILATION AND CURETTAGE
Anesthesia: General

## 2019-08-10 MED ORDER — LEVONORGESTREL 20 MCG/24HR IU IUD
INTRAUTERINE_SYSTEM | INTRAUTERINE | Status: AC
  Filled 2019-08-10: qty 1

## 2019-08-10 MED ORDER — ONDANSETRON HCL 4 MG/2ML IJ SOLN
INTRAMUSCULAR | Status: DC | PRN
  Administered 2019-08-10: 4 mg via INTRAVENOUS

## 2019-08-10 MED ORDER — ACETAMINOPHEN 500 MG PO TABS
1000.0000 mg | ORAL_TABLET | Freq: Once | ORAL | Status: AC
  Administered 2019-08-10: 1000 mg via ORAL

## 2019-08-10 MED ORDER — LIDOCAINE HCL (CARDIAC) PF 100 MG/5ML IV SOSY
PREFILLED_SYRINGE | INTRAVENOUS | Status: DC | PRN
  Administered 2019-08-10: 100 mg via INTRAVENOUS

## 2019-08-10 MED ORDER — PROPOFOL 10 MG/ML IV BOLUS
INTRAVENOUS | Status: AC
  Filled 2019-08-10: qty 20

## 2019-08-10 MED ORDER — CELECOXIB 200 MG PO CAPS
ORAL_CAPSULE | ORAL | Status: AC
  Filled 2019-08-10: qty 1

## 2019-08-10 MED ORDER — LIDOCAINE HCL 1 % IJ SOLN
INTRAMUSCULAR | Status: DC | PRN
  Administered 2019-08-10: 10 mL

## 2019-08-10 MED ORDER — FENTANYL CITRATE (PF) 100 MCG/2ML IJ SOLN
INTRAMUSCULAR | Status: AC
  Filled 2019-08-10: qty 2

## 2019-08-10 MED ORDER — SCOPOLAMINE 1 MG/3DAYS TD PT72
1.0000 | MEDICATED_PATCH | TRANSDERMAL | Status: DC
  Administered 2019-08-10: 1.5 mg via TRANSDERMAL

## 2019-08-10 MED ORDER — MIDAZOLAM HCL 2 MG/2ML IJ SOLN
INTRAMUSCULAR | Status: DC | PRN
  Administered 2019-08-10: 2 mg via INTRAVENOUS

## 2019-08-10 MED ORDER — DEXAMETHASONE SODIUM PHOSPHATE 10 MG/ML IJ SOLN: 4.0000 mg | INTRAMUSCULAR | Status: DC

## 2019-08-10 MED ORDER — PROPOFOL 10 MG/ML IV BOLUS
INTRAVENOUS | Status: DC | PRN
  Administered 2019-08-10: 50 mg via INTRAVENOUS
  Administered 2019-08-10: 150 mg via INTRAVENOUS

## 2019-08-10 MED ORDER — SCOPOLAMINE 1 MG/3DAYS TD PT72
MEDICATED_PATCH | TRANSDERMAL | Status: AC
  Filled 2019-08-10: qty 1

## 2019-08-10 MED ORDER — CELECOXIB 200 MG PO CAPS
200.0000 mg | ORAL_CAPSULE | Freq: Once | ORAL | Status: AC
  Administered 2019-08-10: 200 mg via ORAL

## 2019-08-10 MED ORDER — GABAPENTIN 100 MG PO CAPS
200.0000 mg | ORAL_CAPSULE | ORAL | Status: AC
  Administered 2019-08-10: 200 mg via ORAL

## 2019-08-10 MED ORDER — MIDAZOLAM HCL 2 MG/2ML IJ SOLN
INTRAMUSCULAR | Status: AC
  Filled 2019-08-10: qty 2

## 2019-08-10 MED ORDER — PROMETHAZINE HCL 25 MG/ML IJ SOLN: 6.2500 mg | INTRAMUSCULAR | Status: DC | PRN

## 2019-08-10 MED ORDER — CELECOXIB 200 MG PO CAPS: 400.0000 mg | ORAL_CAPSULE | ORAL | Status: DC

## 2019-08-10 MED ORDER — FENTANYL CITRATE (PF) 100 MCG/2ML IJ SOLN: 25.0000 ug | INTRAMUSCULAR | Status: DC | PRN

## 2019-08-10 MED ORDER — LACTATED RINGERS IV SOLN: INTRAVENOUS | Status: DC

## 2019-08-10 MED ORDER — LEVONORGESTREL 20 MCG/24HR IU IUD
INTRAUTERINE_SYSTEM | INTRAUTERINE | Status: AC
  Administered 2019-08-10: 1 via INTRAUTERINE

## 2019-08-10 MED ORDER — LIDOCAINE 2% (20 MG/ML) 5 ML SYRINGE
INTRAMUSCULAR | Status: AC
  Filled 2019-08-10: qty 5

## 2019-08-10 MED ORDER — DEXAMETHASONE SODIUM PHOSPHATE 10 MG/ML IJ SOLN
INTRAMUSCULAR | Status: DC | PRN
Start: 2019-08-10 — End: 2019-08-10
  Administered 2019-08-10: 5 mg via INTRAVENOUS

## 2019-08-10 MED ORDER — ACETAMINOPHEN 500 MG PO TABS
ORAL_TABLET | ORAL | Status: AC
  Filled 2019-08-10: qty 2

## 2019-08-10 MED ORDER — ACETAMINOPHEN 500 MG PO TABS: 1000.0000 mg | ORAL_TABLET | ORAL | Status: DC

## 2019-08-10 MED ORDER — GABAPENTIN 100 MG PO CAPS
ORAL_CAPSULE | ORAL | Status: AC
  Filled 2019-08-10: qty 2

## 2019-08-10 MED ORDER — FENTANYL CITRATE (PF) 100 MCG/2ML IJ SOLN
INTRAMUSCULAR | Status: DC | PRN
  Administered 2019-08-10: 50 ug via INTRAVENOUS
  Administered 2019-08-10: 25 ug via INTRAVENOUS

## 2019-08-10 SURGICAL SUPPLY — 12 items
CATH ROBINSON RED A/P 16FR (CATHETERS) ×2 IMPLANT
COVER WAND RF STERILE (DRAPES) ×2 IMPLANT
GLOVE BIO SURGEON STRL SZ 6 (GLOVE) ×4 IMPLANT
GLOVE ECLIPSE 6.5 STRL STRAW (GLOVE) ×1 IMPLANT
GOWN STRL REUS W/TWL LRG LVL3 (GOWN DISPOSABLE) ×3 IMPLANT
KIT TURNOVER CYSTO (KITS) ×2 IMPLANT
Mirena ×1 IMPLANT
PACK VAGINAL MINOR WOMEN LF (CUSTOM PROCEDURE TRAY) ×2 IMPLANT
PAD OB MATERNITY 4.3X12.25 (PERSONAL CARE ITEMS) ×2 IMPLANT
SUT VIC AB 0 CT1 36 (SUTURE) ×2 IMPLANT
TOWEL OR 17X26 10 PK STRL BLUE (TOWEL DISPOSABLE) ×2 IMPLANT
WATER STERILE IRR 500ML POUR (IV SOLUTION) ×2 IMPLANT

## 2019-08-10 NOTE — Op Note (Signed)
OPERATIVE NOTE  PATIENT: [name] DATE: 08/10/19   Preop Diagnosis: postmenopausal bleeding, history of endometrial hyperplasia  Postoperative Diagnosis: same  Surgery: D&C (dilation and curettage) with removal and replacement of Mirena IUD.  Surgeons:  Donaciano Eva, MD Assistant: none  Anesthesia: General   Estimated blood loss: <50ml  IVF:  258ml   Urine output: not recorded   Complications: None   Pathology: endometrial curettings  Operative findings: uterus sounded to 6cm, IUD in uterine fundus. Removed in tact.  New IUD placed in endometrial cavity and deployed at fundus ([place] confirmed).   Procedure: The patient was identified in the preoperative holding area. Informed consent was signed on the chart. Patient was seen history was reviewed and exam was performed.   The patient was then taken to the operating room and placed in the supine position with SCD hose on. General anesthesia was then induced without difficulty. She was then placed in the dorsolithotomy position. The perineum was prepped with Betadine. The vagina was prepped with Betadine. The patient was then draped after the prep was dried. A foley was not used to empty the bladder as bladder distension to some degree was necessary for visualization of the uterus.  Timeout was performed the patient, procedure, antibiotic, allergy, and length of procedure.   Intraoperative [place] was necessary due to the retained IUD with no strings from the cervix.  The weighted speculum was placed in the posterior vagina. The single tooth tenaculum was placed on the anterior lip of the cervix. 10cc of 1% lidocaine was used as a paracervical block. The uterine sound was placed in the cervix and advanced to the fundus under US guidance to ensure no perforation. The cervix was successively dilated using pratts dilators to 30. Under US guidance the polyp forceps were placed within the uterine cavity and the IUD was  grasped and removed with ease. It appeared in tact with no retained or fragmented components.  A sharp curette was advanced to the fundus and a comprehensive curette of the endometrial cavity took place until a gritty feel was appreciated. The specimen was collected on a telfa and sent for permanent pathology.  A new IUD was opened with ex date Sept 2023. Lot number [id number]. It was deployed under [place] guidance at the fundus and strings were cut to 3 inches.   The tenaculum was removed and hemostasis was observed.   The vagina was irrigated.  All instrument, suture, laparotomy, Ray-Tec, and needle counts were correct x2. The patient tolerated the procedure well and was taken recovery room in stable condition. This is [name] dictating an operative note on [name].  [name], MD

## 2019-08-10 NOTE — Anesthesia Procedure Notes (Signed)
Procedure Name: LMA Insertion Date/Time: 08/10/2019 2:20 PM Performed by: Raenette Rover, CRNA Pre-anesthesia Checklist: Patient identified, Emergency Drugs available, Suction available and Patient being monitored Patient Re-evaluated:Patient Re-evaluated prior to induction Oxygen Delivery Method: Circle system utilized Preoxygenation: Pre-oxygenation with 100% oxygen Induction Type: IV induction Ventilation: Mask ventilation without difficulty LMA: LMA inserted LMA Size: 4.0 Number of attempts: 1 Placement Confirmation: positive ETCO2 and breath sounds checked- equal and bilateral Tube secured with: Tape Dental Injury: Teeth and Oropharynx as per pre-operative assessment

## 2019-08-10 NOTE — Interval H&P Note (Signed)
History and Physical Interval Note:  08/10/2019 1:10 PM  [name]  has presented today for surgery, with the diagnosis of POST MENOPAUSAL BLEEDING.  The various methods of treatment have been discussed with the patient and family. After consideration of risks, benefits and other options for treatment, the patient has consented to  Procedure(s) with comments: [place] (N/A) OPERATIVE ULTRASOUND (N/A) INTRAUTERINE DEVICE (IUD) REMOVAL AND REINSERTION (N/A) - PHARMACY MIRENA IUD as a surgical intervention.  The patient's history has been reviewed, patient examined, no change in status, stable for surgery.  I have reviewed the patient's chart and labs.  Her office endometrial biopsy was benign and her US showed a 74mm endometrium with an IUD in the upper fundus and normal size uterus. Questions were answered to the patient's satisfaction.     [name]

## 2019-08-10 NOTE — Transfer of Care (Signed)
Immediate Anesthesia Transfer of Care Note  Patient: [name]  Procedure(s) Performed: DILATATION AND CURETTAGE OF THE UTERUS UNDER ULTRASOUND GUIDANCE (N/A ) OPERATIVE ULTRASOUND (N/A ) INTRAUTERINE DEVICE (IUD) REMOVAL AND REINSERTION (N/A )  Patient Location: PACU  Anesthesia Type:General  Level of Consciousness: awake, alert , oriented, drowsy and patient cooperative  Airway & Oxygen Therapy: Patient Spontanous Breathing and Patient connected to nasal cannula oxygen  Post-op Assessment: Report given to RN and Post -op Vital signs reviewed and stable  Post vital signs: Reviewed and stable  Last Vitals:  Vitals Value Taken Time  BP 141/82 08/10/19 1452  Temp    Pulse 67 08/10/19 1454  Resp 16 08/10/19 1454  SpO2 100 % 08/10/19 1454  Vitals shown include unvalidated device data.  Last Pain:  Vitals:   08/10/19 1257  TempSrc: Oral  PainSc: 0-No pain      Patients Stated Pain Goal: 4 (40/34/74 2595)  Complications: No complications documented.

## 2019-08-10 NOTE — Anesthesia Preprocedure Evaluation (Addendum)
Anesthesia Evaluation  Patient identified by MRN, date of birth, ID band Patient awake    Reviewed: Allergy & Precautions, NPO status , Patient's Chart, lab work & pertinent test results  History of Anesthesia Complications Negative for: history of anesthetic complications  Airway Mallampati: III  TM Distance: >3 FB Neck ROM: Full    Dental no notable dental hx. (+) Dental Advisory Given   Pulmonary neg pulmonary ROS,    Pulmonary exam normal        Cardiovascular negative cardio ROS Normal cardiovascular exam     Neuro/Psych PSYCHIATRIC DISORDERS Anxiety negative neurological ROS     GI/Hepatic Neg liver ROS, GERD  Medicated,  Endo/Other  Morbid obesity  Renal/GU negative Renal ROS     Musculoskeletal negative musculoskeletal ROS (+)   Abdominal   Peds  Hematology negative hematology ROS (+)   Anesthesia Other Findings   Reproductive/Obstetrics                            Anesthesia Physical Anesthesia Plan  ASA: III  Anesthesia Plan: General   Post-op Pain Management:    Induction: Intravenous  PONV Risk Score and Plan: 3 and Ondansetron, Dexamethasone and Midazolam  Airway Management Planned: LMA  Additional Equipment:   Intra-op Plan:   Post-operative Plan: Extubation in OR  Informed Consent: I have reviewed the patients History and Physical, chart, labs and discussed the procedure including the risks, benefits and alternatives for the proposed anesthesia with the patient or authorized representative who has indicated his/her understanding and acceptance.       Plan Discussed with: Anesthesiologist and CRNA  Anesthesia Plan Comments:        Anesthesia Quick Evaluation

## 2019-08-10 NOTE — Discharge Instructions (Signed)
Post Anesthesia Home Care Instructions  Activity: Get plenty of rest for the remainder of the day. A responsible adult should stay with you for 24 hours following the procedure.  For the next 24 hours, DO NOT: -Drive a car -Paediatric nurse -Drink alcoholic beverages -Take any medication unless instructed by your physician -Make any legal decisions or sign important papers.  Meals: Start with liquid foods such as gelatin or soup. Progress to regular foods as tolerated. Avoid greasy, spicy, heavy foods. If nausea and/or vomiting occur, drink only clear liquids until the nausea and/or vomiting subsides. Call your physician if vomiting continues.  Special Instructions/Symptoms: Your throat may feel dry or sore from the anesthesia or the breathing tube placed in your throat during surgery. If this causes discomfort, gargle with warm salt water. The discomfort should disappear within 24 hours.  If you had a scopolamine patch placed behind your ear for the management of post- operative nausea and/or vomiting:  1. The medication in the patch is effective for 72 hours, after which it should be removed.  Wrap patch in a tissue and discard in the trash. Wash hands thoroughly with soap and water. 2. You may remove the patch earlier than 72 hours if you experience unpleasant side effects which may include dry mouth, dizziness or visual disturbances. 3. Avoid touching the patch. Wash your hands with soap and water after contact with the patch.   D&C, Care After ACTIVITY  Rest as much as possible the first night after discharge.  You do not have weight restrictions on lifting  Avoid strenuous working out such as running or lifting weights for 24 hours  You can climb stairs and drive a car NUTRITION  You may resume your normal diet.  Drink 6 to 8 glasses of fluids a day.  Eat a healthy, balanced diet including portions of food from the meat (protein), milk, fruit, vegetable, and bread  groups.  Your caregiver may recommend you take a multivitamin with iron.  ELIMINATION   If constipation occurs, drink more liquids, and add more fruits, vegetables, and bran to your diet. You may take a mild laxative, such as Milk of Magnesia, Metamucil, or a stool softener such as Colace, with permission from your caregiver.  HYGIENE  You may shower and wash your hair.  Avoid tub baths for 4 weeks  Do not add any bath oils or chemicals to your bath water, after you have permission to take baths.  Avoid placing anything in the vagina for 4 weeks. It is normal to pass blood from the vagina for up to 3 months. If bleeding continues after that time, please contact Dr Serita Grit office at [phone].   MEDICATIONS Take tylenol or ibuprofen for the uterine cramps. Take as directed on the medication instructions on the bottle.   HOME CARE INSTRUCTIONS   Take your temperature twice a day and record it, especially if you feel feverish or have chills.  Follow your caregiver's instructions about medicines, activity, and follow-up appointments after surgery.  Do not drink alcohol while taking pain medicine.  You may take over-the-counter medicine for pain, recommended by your caregiver.  If your pain is not relieved with medicine, call your caregiver.  Do not take aspirin because it can cause bleeding.  Do not douche or use tampons (use a nonperfumed sanitary pad).  Do not have sexual intercourse for 6 weeks postoperatively. Hugging, kissing, and playful sexual activity is fine with your caregiver's permission.  Take showers instead  of baths, until your caregiver gives you permission to take baths.  You may take a mild medicine for constipation, recommended by your caregiver. Bran foods and drinking a lot of fluids will help with constipation.  Make sure your family understands everything about your operation and recovery.  SEEK MEDICAL CARE IF:    You notice a foul smell coming  from the vagina.  You have painful or bloody urination.  You develop nausea and vomiting.  You develop diarrhea.  You develop a rash.  You have a reaction or allergy from the medicine.  You feel dizzy or light-headed.  You need stronger pain medicine.   SEEK IMMEDIATE MEDICAL CARE IF:   You develop a temperature of 102 F (38.9 C) or higher.  You pass out.  You develop leg or chest pain.  You develop abdominal pain.  You develop shortness of breath.  You bleed heavier than a period (soaking through 2 or more pads per hour for 2 hours in a row).  You see pus in the wound area.  MAKE SURE YOU:   Understand these instructions.  Will watch your condition.  Will get help right away if you are not doing well or get worse.  Followup Dr Denman George does not need you to schedule routine follow-up until IUD removal and replacement in 5 years (July, 2026). It is also safe and reasonable for this to be performed by your local gynecologist. Document Released: 08/15/2003 Document Revised: 05/17/2013 Document Reviewed: 12/02/2008 [facility] Patient Information 2015 [place], [place]. This information is not intended to replace advice given to you by your health care provider. Make sure you discuss any questions you have with your health care provider.

## 2019-08-10 NOTE — Anesthesia Postprocedure Evaluation (Signed)
Anesthesia Post Note  Patient: [name]  Procedure(s) Performed: DILATATION AND CURETTAGE OF THE UTERUS UNDER ULTRASOUND GUIDANCE (N/A ) OPERATIVE ULTRASOUND (N/A ) INTRAUTERINE DEVICE (IUD) REMOVAL AND REINSERTION (N/A )     Patient location during evaluation: PACU Anesthesia Type: General Level of consciousness: sedated Pain management: pain level controlled Vital Signs Assessment: post-procedure vital signs reviewed and stable Respiratory status: spontaneous breathing and respiratory function stable Cardiovascular status: stable Postop Assessment: no apparent nausea or vomiting Anesthetic complications: no   No complications documented.  Last Vitals:  Vitals:   08/10/19 1525 08/10/19 1554  BP:  (!) 150/95  Pulse: 61 64  Resp: 19 20  Temp:    SpO2: 98% 99%    Last Pain:  Vitals:   08/10/19 1554  TempSrc:   PainSc: 0-No pain                 Kennadi Albany DANIEL

## 2019-08-11 ENCOUNTER — Encounter (HOSPITAL_BASED_OUTPATIENT_CLINIC_OR_DEPARTMENT_OTHER): Payer: Self-pay | Admitting: Gynecologic Oncology

## 2019-08-11 ENCOUNTER — Telehealth: Payer: Self-pay

## 2019-08-11 LAB — SURGICAL PATHOLOGY

## 2019-08-11 NOTE — Telephone Encounter (Signed)
LM for [name] to call back to the gyn oncology office to discuss how she is doing after her surgery yesterday.

## 2019-08-12 ENCOUNTER — Telehealth: Payer: Self-pay

## 2019-08-12 NOTE — Telephone Encounter (Signed)
Spoke with patient to inform that no pre cancer or cancer seen on pathology report.  Pt reports she has a light amount of discharge but is otherwise doing well.  Pt voiced understanding of above info and thanks for call.

## 2019-12-07 ENCOUNTER — Encounter: Payer: Self-pay | Admitting: Internal Medicine

## 2019-12-16 ENCOUNTER — Encounter: Payer: Self-pay | Admitting: Nurse Practitioner

## 2020-01-25 ENCOUNTER — Ambulatory Visit: Payer: BC Managed Care – PPO | Admitting: Nurse Practitioner

## 2020-02-10 ENCOUNTER — Ambulatory Visit: Payer: BC Managed Care – PPO | Admitting: Nurse Practitioner

## 2020-02-10 ENCOUNTER — Encounter: Payer: Self-pay | Admitting: Nurse Practitioner

## 2020-02-10 ENCOUNTER — Other Ambulatory Visit: Payer: Self-pay

## 2020-02-10 DIAGNOSIS — K625 Hemorrhage of anus and rectum: Secondary | ICD-10-CM | POA: Diagnosis not present

## 2020-02-10 DIAGNOSIS — K219 Gastro-esophageal reflux disease without esophagitis: Secondary | ICD-10-CM

## 2020-02-10 DIAGNOSIS — K317 Polyp of stomach and duodenum: Secondary | ICD-10-CM | POA: Insufficient documentation

## 2020-02-10 DIAGNOSIS — R197 Diarrhea, unspecified: Secondary | ICD-10-CM

## 2020-02-10 NOTE — Progress Notes (Signed)
Primary Care Physician:  Manon Hilding, MD Primary Gastroenterologist:  Dr. Abbey Chatters  Chief Complaint  Patient presents with  . Rectal Bleeding    Still ongoing but not as bad since procedures, lot less. Had TCS/[facility]GD done at [facility]. Careful with what she is eating. Bloody diarrhea is worse with certain foods. No bloody diarrhea since 12/7. When she has BM she can see small stool pieces that has red pieces and will have a little blood on tissue paper.    HPI:   [name] is a 65 y.o. female who presents on referral from primary care for rectal bleeding.  Review of the chart it appears that a surgeon completed colonoscopy and endoscopy on the patient for rectal bleeding on 01/13/2020.  The H&P at that time noted nausea with food occasionally, no vomiting or abdominal pain.  No anemia.  Loose stools since laparoscopic cholecystectomy.  Notes 2-week of blood on the toilet tissue in November and on the outside of the stool as well.  No melena.  EGD findings include gastritis status post biopsy, normal esophagus, normal duodenum.  Multiple gastric polyps.  The patient was referred to GI for biopsy.  Colonoscopy found fair prep, a single 5 mm polyp status post resection, erythematous mucosa and blood from 25 cm to 40 cm proximal to the anus status post multiple biopsies likely the cause of rectal bleeding.  Diverticulosis noted.  Indicated depending on pathology results may need GI referral.  Unfortunately because this was not completed in our system we are unable to obtain the pathology results from care everywhere.  The patient did follow-up with the surgeon in the office on 01/20/2020 and indicated "colitis with rectal bleeding, multiple gastric polyps" with a brief note that due to the extent of inflammation in the descending/sigmoid current 1 she was referred to GI for further work-up to include inflammatory bowel disease.  Also recommended deferring to GI to presumably repeat an EGD to actually remove  the large gastric polyps or biopsy done.  Today she states she is doing okay overall. In November 2020 she had cholecystectomy and has had loose stools since then. That was ongoing from 11/2018 - 06/2019; stools regular and typically formed in the morning and loose later on. During summer she feels she ate too many tomatoes. In September she had 5 straight days of watery diarrhea. She did a bland diet and liquids for 2 weeks which got her back to her "new normal" until October. End of October she began seeing blood in her stools, still diarrhea. The blood was minor and thought it was hemorrhoids. Then it started getting heavier and on the stools. In November she began having more bloody diarrhea so she saw her PCP who referred her to our office. Because our earliest appointment was January she saw the surgeon for colonoscopy.  Colonoscopy and EGD was repeated. (See descriptions above)  Since her colonoscopy she has not had bloody diarrhea. Has stopped eating certain things she felt could be causing her symptoms. Stools are now about [place] 4/5 to the morning and then later in the day [place] 6. Generally only 2 stools a day, occasionally a third stool. Did have some more blood yesterday and today. Not dripping into the commode, more on the/in the stool. No further abdominal pain. Denies fatigue/weakness. Does have some nausea with eating, no vomiting. Her PCP added a second Prilosec dose which helped her nausea significantly as has cutting grease/oil from her diet. Denies fever,  chills, unintentional weight loss. Denies rashes or mouth sores. When symptoms started she was taking Aleve bid and baby ASA daily; when she saw blood she stopped it. Denies URI or flu-like symptoms. Denies loss of sense of taste or smell. The patient has received COVID-19 vaccination(s). They have also had a booster dose. Denies chest pain, dyspnea, dizziness, lightheadedness, syncope, near syncope. Denies any other upper or lower GI  symptoms.  Past Medical History:  Diagnosis Date  . Anxiety   . Arthritis    knees, thumbs  . 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 GERD (gastroesophageal reflux disease)   . History of kidney stones 07/2014  . Plantar fascial fibromatosis of left foot   . Sinus problem     Past Surgical History:  Procedure Laterality Date  . CARPAL TUNNEL RELEASE Right 10/04/2014   Procedure: RIGHT CARPAL TUNNEL RELEASE;  Surgeon: Daryll Brod, MD;  Location: [place];  Service: Orthopedics;  Laterality: Right;  REG/FAB  . CARPAL TUNNEL RELEASE Left 11/15/2014   Procedure: LEFT CARPAL TUNNEL RELEASE;  Surgeon: Daryll Brod, MD;  Location: [place];  Service: Orthopedics;  Laterality: Left;  . CERVICAL CONIZATION W/BX  04/2014  . CHOLECYSTECTOMY  11/30/2014  . DILATION AND CURETTAGE OF UTERUS  04/2014  . DILATION AND CURETTAGE OF UTERUS N/A 08/10/2019   Procedure: DILATATION AND CURETTAGE OF THE UTERUS UNDER ULTRASOUND GUIDANCE;  Surgeon: [name], MD;  Location: [place];  Service: Gynecology;  Laterality: N/A;  . HYMENECTOMY  age 73 or age 67  . hysteroscoptic insertion of mirena IUD  08/2015  . HYSTEROSCOPY  02/2014  . INTRAUTERINE DEVICE (IUD) INSERTION N/A 08/10/2019   Procedure: INTRAUTERINE DEVICE (IUD) REMOVAL AND REINSERTION;  Surgeon: [name], MD;  Location[facility];  Service: Gynecology;  Laterality: N/A;  PHARMACY MIRENA IUD  . OPERATIVE ULTRASOUND N/A 08/10/2019   Procedure: OPERATIVE ULTRASOUND;  Surgeon: [name], MD;  Location: [facility];  Service: Gynecology;  Laterality: N/A;  . uterine polyps removed  06/03/2018  . VARICOSE VEIN SURGERY Bilateral 15 yrs ago   tx with injections only no sx  . WISDOM TOOTH EXTRACTION     as a teenager    Current Outpatient Medications  Medication Sig Dispense Refill  . acetaminophen (TYLENOL) 500 MG tablet Take by mouth.    Marland Kitchen FLUoxetine (PROZAC)  20 MG tablet Take 20 mg by mouth daily.    Marland Kitchen omeprazole (PRILOSEC) 20 MG capsule Take 20 mg by mouth 2 (two) times daily before a meal.     No current facility-administered medications for this visit.    Allergies as of 02/10/2020  . (No Known Allergies)    Family History  Problem Relation Age of Onset  . Diabetes Mother   . Stroke Mother   . Heart disease Father   . Cancer Maternal Aunt        Breast  . Colon cancer Neg Hx   . Gastric cancer Neg Hx   . Esophageal cancer Neg Hx     Social History   Socioeconomic History  . Marital status: Widowed    Spouse name: Not on file  . Number of children: Not on file  . Years of education: Not on file  . Highest education level: Not on file  Occupational History  . Not on file  Tobacco Use  . Smoking status: Never Smoker  . Smokeless tobacco: Never Used  Vaping Use  .  Vaping Use: Never used  Substance and Sexual Activity  . Alcohol use: No  . Drug use: No  . Sexual activity: Not Currently  Other Topics Concern  . Not on file  Social History Narrative  . Not on file   Social Determinants of Health   Financial Resource Strain: Not on file  Food Insecurity: Not on file  Transportation Needs: Not on file  Physical Activity: Not on file  Stress: Not on file  Social Connections: Not on file  Intimate Partner Violence: Not on file    Subjective: Review of Systems  Constitutional: Negative for chills, fever, malaise/fatigue and weight loss.  HENT: Negative for congestion and sore throat.   Respiratory: Negative for cough and shortness of breath.   Cardiovascular: Negative for chest pain and palpitations.  Gastrointestinal: Negative for abdominal pain, blood in stool, diarrhea, melena, nausea and vomiting.       Denies further bloody diarrhea  Musculoskeletal: Negative for joint pain and myalgias.  Skin: Negative for rash.  Neurological: Negative for dizziness and weakness.  Endo/Heme/Allergies: Does not bruise/bleed  easily.  Psychiatric/Behavioral: Negative for depression. The patient is not nervous/anxious.   All other systems reviewed and are negative.      Objective: BP (!) 155/80   Pulse 83   Temp (!) 97.5 F (36.4 C)   Ht 5' 2"  (1.575 m)   Wt 241 lb 6.4 oz (109.5 kg)   BMI 44.15 kg/m  Physical Exam Vitals and nursing note reviewed.  Constitutional:      General: She is not in acute distress.    Appearance: Normal appearance. She is well-developed. She is obese. She is not ill-appearing, toxic-appearing or diaphoretic.  HENT:     Head: Normocephalic and atraumatic.     Nose: No congestion or rhinorrhea.  Eyes:     General: No scleral icterus. Cardiovascular:     Rate and Rhythm: Normal rate and regular rhythm.     Heart sounds: Normal heart sounds.  Pulmonary:     Effort: Pulmonary effort is normal. No respiratory distress.     Breath sounds: Normal breath sounds.  Abdominal:     General: Bowel sounds are normal.     Palpations: Abdomen is soft. There is no hepatomegaly, splenomegaly or mass.     Tenderness: There is no abdominal tenderness. There is no guarding or rebound.     Hernia: No hernia is present.  Skin:    General: Skin is warm and dry.     Coloration: Skin is not jaundiced.     Findings: No rash.  Neurological:     General: No focal deficit present.     Mental Status: She is alert and oriented to person, place, and time.  Psychiatric:        Attention and Perception: Attention normal.        Mood and Affect: Mood normal.        Speech: Speech normal.        Behavior: Behavior normal.        Thought Content: Thought content normal.        Cognition and Memory: Cognition and memory normal.      Assessment:  Very pleasant 65 year old female who presents for evaluation of rectal bleeding.  She did have a colonoscopy and upper endoscopy as described in HPI.  Overall she is doing significantly better.  No other red flag/warning signs or symptoms.  Rectal  bleeding: She did bring [place] pictures from her colonoscopy.  There is significant erythema and inflammation in her colon, although not in a typical pattern for UC.  I reviewed these images and discussed with Dr. Abbey Chatters.  Given that her initial onset of symptoms was after taking Aleve twice a day for some time this could be NSAID induced.  She feels that her symptoms are better, has not had a "bloody diarrhea" in a while.  No abdominal pain.  She does have a loose stool later in the day, which is her baseline after cholecystectomy.  She has seen some mild tissue hematochezia or mild/small amount of blood on the solid stool but much less in amount much less frequent.  After discussing with Dr. Abbey Chatters and with her we decided to recommend repeating her colonoscopy in about 6 months.  She is to call us for any worsening symptoms, worsening bleeding or abdominal pain at which point we can decide whether to repeat sooner.  In the interim I will check labs to evaluate as well.  GERD with associated nausea: Known history of GERD.  She was having some incremental nausea but no vomiting.  Her PPI was increased by her primary care and this is resolved her nausea symptoms.  GERD generally doing well.  Gastric polyps: On her EGD she did have multiple gastric polyps which appear to be simple fundic gland polyp on images.  However, we can repeat her EGD in 6 months with her colonoscopy for biopsy to be sure.   Plan: 1. CBC, CMP, CRP, ESR 2. Monitor symptoms and notify us of any recurrent or worsening bleeding, abdominal pain, worsening diarrhea 3. We will plan to repeat EGD and colonoscopy in 6 months.  If her symptoms worsen or return we can plan to do this sooner 4. Follow-up in 4 months otherwise    Thank you for allowing [place] to participate in the care of Shelia [place], DNP, AGNP-C Adult & Gerontological Nurse Practitioner [facility]   02/10/2020 4:27 PM   Disclaimer:  This note was dictated with voice recognition software. Similar sounding words can inadvertently be transcribed and may not be corrected upon review.

## 2020-02-10 NOTE — Patient Instructions (Signed)
Your health issues we discussed today were:   Bloody diarrhea from inflammation in the colon: 1. As we discussed, we cannot be sure if this was a limited infection or an inflammatory bowel disease 2. The NSAIDs (Aleve) could have triggered this as well 3. I am going to check labs including a blood cell count, electrolytes/kidney function/liver function panel, and inflammatory markers to further evaluate 4. Keep a close eye on your symptoms and if you have returning, worsening bloody diarrhea and/or abdominal pain and notify our office  Polyps in your stomach: 1. We will plan to repeat your endoscopy with your colonoscopy at which point we can take a biopsy of the polyps 2. When looking at the images they appear to be simple "fundic gland polyps" which are benign and not concerning 3. Further recommendations will follow your endoscopy in 6 months  GERD (reflux/heartburn) with nausea: 1. I am glad your symptoms improved when increasing your medication to twice a day 2. Continue to take this for now and let us know if you have any worsening or severe symptoms  Overall I recommend:  1. Continue other current medications 2. Return for follow-up in 4 months 3. Call us for any questions or concerns   ---------------------------------------------------------------  I am glad you have gotten your COVID-19 vaccination!  Even though you are fully vaccinated you should continue to follow CDC and state/local guidelines.  ---------------------------------------------------------------   At [facility] we value your feedback. You may receive a survey about your visit today. Please share your experience as we strive to create trusting relationships with our patients to provide genuine, compassionate, quality care.  We appreciate your understanding and patience as we review any laboratory studies, imaging, and other diagnostic tests that are ordered as we care for you. Our office  policy is 5 business days for review of these results, and any emergent or urgent results are addressed in a timely manner for your best interest. If you do not hear from our office in 1 week, please contact us.   We also encourage the use of MyChart, which contains your medical information for your review as well. If you are not enrolled in this feature, an access code is on this after visit summary for your convenience. Thank you for allowing [place] to be involved in your care.  It was great to see you today!  I hope you have a safe and warm winter!!

## 2020-02-11 ENCOUNTER — Encounter: Payer: Self-pay | Admitting: Internal Medicine

## 2020-02-11 NOTE — Progress Notes (Signed)
Cc'ed to pcp °

## 2020-02-12 LAB — COMPREHENSIVE METABOLIC PANEL
AG Ratio: 1.5 (calc) (ref 1.0–2.5)
ALT: 8 U/L (ref 6–29)
AST: 11 U/L (ref 10–35)
Albumin: 4.1 g/dL (ref 3.6–5.1)
Alkaline phosphatase (APISO): 52 U/L (ref 37–153)
BUN: 16 mg/dL (ref 7–25)
CO2: 27 mmol/L (ref 20–32)
Calcium: 9.7 mg/dL (ref 8.6–10.4)
Chloride: 104 mmol/L (ref 98–110)
Creat: 0.78 mg/dL (ref 0.50–0.99)
Globulin: 2.7 g/dL (calc) (ref 1.9–3.7)
Glucose, Bld: 94 mg/dL (ref 65–99)
Potassium: 4.7 mmol/L (ref 3.5–5.3)
Sodium: 140 mmol/L (ref 135–146)
Total Bilirubin: 0.4 mg/dL (ref 0.2–1.2)
Total Protein: 6.8 g/dL (ref 6.1–8.1)

## 2020-02-12 LAB — CBC WITH DIFFERENTIAL/PLATELET
Absolute Monocytes: 656 cells/uL (ref 200–950)
Basophils Absolute: 41 cells/uL (ref 0–200)
Basophils Relative: 0.6 %
Eosinophils Absolute: 138 cells/uL (ref 15–500)
Eosinophils Relative: 2 %
HCT: 39.1 % (ref 35.0–45.0)
Hemoglobin: 12.8 g/dL (ref 11.7–15.5)
Lymphs Abs: 1663 cells/uL (ref 850–3900)
MCH: 30.8 pg (ref 27.0–33.0)
MCHC: 32.7 g/dL (ref 32.0–36.0)
MCV: 94 fL (ref 80.0–100.0)
MPV: 9.6 fL (ref 7.5–12.5)
Monocytes Relative: 9.5 %
Neutro Abs: 4402 cells/uL (ref 1500–7800)
Neutrophils Relative %: 63.8 %
Platelets: 365 10*3/uL (ref 140–400)
RBC: 4.16 10*6/uL (ref 3.80–5.10)
RDW: 12.2 % (ref 11.0–15.0)
Total Lymphocyte: 24.1 %
WBC: 6.9 10*3/uL (ref 3.8–10.8)

## 2020-02-12 LAB — C-REACTIVE PROTEIN: CRP: 2.5 mg/L (ref <8.0)

## 2020-02-12 LAB — SEDIMENTATION RATE: Sed Rate: 25 mm/h (ref 0–30)

## 2020-02-23 ENCOUNTER — Telehealth: Payer: Self-pay | Admitting: Internal Medicine

## 2020-02-23 NOTE — Telephone Encounter (Signed)
Patient said her symptoms have not changed since being seen and she hasnt heard from her lab results. [phone]

## 2020-02-23 NOTE — Telephone Encounter (Signed)
Returned the pt's call and was advised that she is still having the same symptoms and she seen her results of labs in her MyChart but she doesn't understand them. I advised her I haven't received them yet so I couldn't report on it until the Dr sends it to me. Please advise.

## 2020-03-03 NOTE — Telephone Encounter (Signed)
Her labs were sent to her MyChart account. Everything looked normal (CBC, CMP, CRP) with no sign of infection, signfiicant blood loss, inflammation (making UC or Crohn's disease less likely).  At her visit she indicated the bleeding was better and diarrhea was back to her "baseline normal" she's had since having her gallbladder removed. Ask her if the symptoms are still about the same (better controlled) or they seems to be worsening.  She can use Imodium if the diarrhea is problematic. If it doesn't help, we could try Questran given her lack of a gallbladder.  Let me know what she says.

## 2020-03-06 NOTE — Telephone Encounter (Signed)
Phoned and advised the pt of her lab results and I asked her was her symptoms still persisting and she stated that since February 14th she has felt better "like things just turned around for me". She doesn't have diarrhea but it is soft and all the blood she was seeing is no longer in her stools. She thanks [place] for getting back to her with this.

## 2020-03-07 NOTE — Telephone Encounter (Signed)
Good to hear

## 2020-03-08 NOTE — Telephone Encounter (Signed)
noted 

## 2020-05-17 ENCOUNTER — Encounter: Payer: Self-pay | Admitting: Internal Medicine

## 2020-05-24 ENCOUNTER — Encounter: Payer: Self-pay | Admitting: Gastroenterology

## 2020-05-25 ENCOUNTER — Ambulatory Visit: Payer: BC Managed Care – PPO | Admitting: Gastroenterology

## 2020-05-31 ENCOUNTER — Ambulatory Visit: Payer: BC Managed Care – PPO | Admitting: Nurse Practitioner

## 2020-06-02 ENCOUNTER — Ambulatory Visit: Payer: BC Managed Care – PPO | Admitting: Gastroenterology

## 2020-06-08 ENCOUNTER — Other Ambulatory Visit: Payer: Self-pay

## 2020-06-08 ENCOUNTER — Encounter: Payer: Self-pay | Admitting: Internal Medicine

## 2020-06-08 ENCOUNTER — Ambulatory Visit: Payer: BC Managed Care – PPO | Admitting: Internal Medicine

## 2020-06-08 VITALS — BP 153/79 | HR 59 | Temp 97.5°F | Ht 60.0 in | Wt 238.0 lb

## 2020-06-08 DIAGNOSIS — K625 Hemorrhage of anus and rectum: Secondary | ICD-10-CM | POA: Diagnosis not present

## 2020-06-08 DIAGNOSIS — K529 Noninfective gastroenteritis and colitis, unspecified: Secondary | ICD-10-CM

## 2020-06-08 DIAGNOSIS — K219 Gastro-esophageal reflux disease without esophagitis: Secondary | ICD-10-CM | POA: Diagnosis not present

## 2020-06-08 NOTE — Progress Notes (Signed)
Referring Provider: Manon Hilding, MD Primary Care Physician:  Manon Hilding, MD Primary GI:  Dr. Abbey Chatters  Chief Complaint  Patient presents with  . Rectal Bleeding    8 episodes this month, with BM, not heavy bleeding per pt, jelly like blood    HPI:   [name] is a 65 y.o. female who presents to clinic today for follow-up visit.  Patient underwent colonoscopy in [facility] for bloody diarrhea in December 2021.  She was found to have colitis approximately 25 cm to 40 cm proximal to the anal verge.  Subsequent biopsies showed colitis infectious versus inflammatory.  Patient was taking Aleve twice daily prior to that time.  Since her colonoscopy she has stopped all NSAIDs and is only taking as needed Tylenol.  She states her bloody diarrhea has resolved completely.  She will have an occasional loose stool which she attributes to not having a gallbladder.  She does have occasional rectal bleeding with blood on the tissue paper but states not near as bad as it was prior.  No abdominal pain.  Does have chronic reflux which is well controlled on omeprazole.  EGD in December 2021 showed normal esophagus normal duodenum, multiple gastric polyps.  Past Medical History:  Diagnosis Date  . Anxiety   . Arthritis    knees, thumbs  . 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 GERD (gastroesophageal reflux disease)   . History of kidney stones 07/2014  . Plantar fascial fibromatosis of left foot   . Sinus problem     Past Surgical History:  Procedure Laterality Date  . CARPAL TUNNEL RELEASE Right 10/04/2014   Procedure: RIGHT CARPAL TUNNEL RELEASE;  Surgeon: Daryll Brod, MD;  Location: [place];  Service: Orthopedics;  Laterality: Right;  REG/FAB  . CARPAL TUNNEL RELEASE Left 11/15/2014   Procedure: LEFT CARPAL TUNNEL RELEASE;  Surgeon: Daryll Brod, MD;  Location: [place];  Service: Orthopedics;  Laterality: Left;  . CERVICAL CONIZATION W/BX  04/2014  .  CHOLECYSTECTOMY  11/30/2014  . DILATION AND CURETTAGE OF UTERUS  04/2014  . DILATION AND CURETTAGE OF UTERUS N/A 08/10/2019   Procedure: DILATATION AND CURETTAGE OF THE UTERUS UNDER ULTRASOUND GUIDANCE;  Surgeon: [name], MD;  Location: [place];  Service: Gynecology;  Laterality: N/A;  . HYMENECTOMY  age 41 or age 27  . hysteroscoptic insertion of mirena IUD  08/2015  . HYSTEROSCOPY  02/2014  . INTRAUTERINE DEVICE (IUD) INSERTION N/A 08/10/2019   Procedure: INTRAUTERINE DEVICE (IUD) REMOVAL AND REINSERTION;  Surgeon: [name], MD;  Location: [facility];  Service: Gynecology;  Laterality: N/A;  PHARMACY MIRENA IUD  . OPERATIVE ULTRASOUND N/A 08/10/2019   Procedure: OPERATIVE ULTRASOUND;  Surgeon: [name], MD;  Location: [facility];  Service: Gynecology;  Laterality: N/A;  . uterine polyps removed  06/03/2018  . VARICOSE VEIN SURGERY Bilateral 15 yrs ago   tx with injections only no sx  . WISDOM TOOTH EXTRACTION     as a teenager    Current Outpatient Medications  Medication Sig Dispense Refill  . acetaminophen (TYLENOL) 500 MG tablet Take 500 mg by mouth every 4 (four) hours as needed.    Marland Kitchen FLUoxetine (PROZAC) 20 MG tablet Take 20 mg by mouth daily.    Marland Kitchen omeprazole (PRILOSEC) 20 MG capsule Take 20 mg by mouth 2 (two) times daily before a meal.     No current facility-administered medications  for this visit.    Allergies as of 06/08/2020  . (No Known Allergies)    Family History  Problem Relation Age of Onset  . Diabetes Mother   . Stroke Mother   . Heart disease Father   . Cancer Maternal Aunt        Breast  . Colon cancer Neg Hx   . Gastric cancer Neg Hx   . Esophageal cancer Neg Hx     Social History   Socioeconomic History  . Marital status: Widowed    Spouse name: Not on file  . Number of children: Not on file  . Years of education: Not on file  . Highest education level: Not on file  Occupational History   . Not on file  Tobacco Use  . Smoking status: Never Smoker  . Smokeless tobacco: Never Used  Vaping Use  . Vaping Use: Never used  Substance and Sexual Activity  . Alcohol use: No  . Drug use: No  . Sexual activity: Not Currently  Other Topics Concern  . Not on file  Social History Narrative  . Not on file   Social Determinants of Health   Financial Resource Strain: Not on file  Food Insecurity: Not on file  Transportation Needs: Not on file  Physical Activity: Not on file  Stress: Not on file  Social Connections: Not on file    Subjective: Review of Systems  Constitutional: Negative for chills and fever.  HENT: Negative for congestion and hearing loss.   Eyes: Negative for blurred vision and double vision.  Respiratory: Negative for cough and shortness of breath.   Cardiovascular: Negative for chest pain and palpitations.  Gastrointestinal: Positive for blood in stool. Negative for abdominal pain, constipation, diarrhea, heartburn, melena and vomiting.  Genitourinary: Negative for dysuria and urgency.  Musculoskeletal: Negative for joint pain and myalgias.  Skin: Negative for itching and rash.  Neurological: Negative for dizziness and headaches.  Psychiatric/Behavioral: Negative for depression. The patient is not nervous/anxious.      Objective: BP (!) 153/79   Pulse (!) 59   Temp (!) 97.5 F (36.4 C)   Ht 5' (1.524 m)   Wt 238 lb (108 kg)   BMI 46.48 kg/m  Physical Exam Constitutional:      Appearance: Normal appearance. She is obese.  HENT:     Head: Normocephalic and atraumatic.  Eyes:     Extraocular Movements: Extraocular movements intact.     Conjunctiva/sclera: Conjunctivae normal.  Cardiovascular:     Rate and Rhythm: Normal rate and regular rhythm.  Pulmonary:     Effort: Pulmonary effort is normal.     Breath sounds: Normal breath sounds.  Abdominal:     General: Bowel sounds are normal.     Palpations: Abdomen is soft.  Musculoskeletal:         General: No swelling. Normal range of motion.     Cervical back: Normal range of motion and neck supple.  Skin:    General: Skin is warm and dry.     Coloration: Skin is not jaundiced.  Neurological:     General: No focal deficit present.     Mental Status: She is alert and oriented to person, place, and time.  Psychiatric:        Mood and Affect: Mood normal.        Behavior: Behavior normal.      Assessment: *Left-sided colitis *Rectal bleeding *GERD-well-controlled on omeprazole 20 mg twice daily  Plan: Discussed  colitis in depth with patient today.  Her symptoms do appear to be vastly improved since stopping NSAIDs.  Other possible etiologies include infectious, patient unfortunately did not have stool studies done.  Possible underlying inflammatory bowel disease such as Crohn's colitis or ulcerative colitis as well.  Recommend she continue to avoid NSAIDs.  We will tentatively plan on colonoscopy in a few months to evaluate healing of her colitis.  We will perform repeat biopsies at that time as well.  Rectal bleeding likely from internal hemorrhoids.  We can discuss hemorrhoid banding pending colonoscopy findings.  Continue on omeprazole 20 mg twice daily for chronic GERD which is well controlled.  06/08/2020 1:58 PM   Disclaimer: This note was dictated with voice recognition software. Similar sounding words can inadvertently be transcribed and may not be corrected upon review.

## 2020-06-08 NOTE — Patient Instructions (Signed)
We will tentatively plan on colonoscopy in August to ensure healing of your colon.  Please call the office in the meantime if you have recurrence of diarrhea.  Continue on omeprazole for your chronic reflux.  Further recommendations after colonoscopy.  At [facility] we value your feedback. You may receive a survey about your visit today. Please share your experience as we strive to create trusting relationships with our patients to provide genuine, compassionate, quality care.  We appreciate your understanding and patience as we review any laboratory studies, imaging, and other diagnostic tests that are ordered as we care for you. Our office policy is 5 business days for review of these results, and any emergent or urgent results are addressed in a timely manner for your best interest. If you do not hear from our office in 1 week, please contact us.   We also encourage the use of MyChart, which contains your medical information for your review as well. If you are not enrolled in this feature, an access code is on this after visit summary for your convenience. Thank you for allowing [place] to be involved in your care.  It was great to see you today!  I hope you have a great rest of your spring!!    [name]. Abbey Chatters, D.O. Gastroenterology and Hepatology [facility]

## 2020-07-05 ENCOUNTER — Encounter: Payer: Self-pay | Admitting: *Deleted

## 2020-07-05 ENCOUNTER — Telehealth: Payer: Self-pay | Admitting: *Deleted

## 2020-07-05 MED ORDER — CLENPIQ 10-3.5-12 MG-GM -GM/160ML PO SOLN: 1.0000 | Freq: Once | ORAL | 0 refills | Status: AC

## 2020-07-05 NOTE — Telephone Encounter (Signed)
Spoke with pt. She has been scheduled for 8/22 at 7:30am. Aware will mail prep instructions with pre-op appt. Confirmed Rx sent to [facility].

## 2020-07-05 NOTE — Addendum Note (Signed)
Addended by: Cheron Every on: 07/05/2020 12:07 PM   Modules accepted: Orders

## 2020-07-05 NOTE — Telephone Encounter (Signed)
LMOVM to call back to schedule TCS with propofol, ASA 3 with Dr. Abbey Chatters in August

## 2020-08-28 NOTE — Patient Instructions (Signed)
[name]  08/28/2020     '@PREFPERIOPPHARMACY'$ @   Your procedure is scheduled on  09/04/2020.   Report to [facility] at  [phone] A.M.   Call this number if you have problems the morning of surgery:  [phone]   Remember:  Follow the diet and prep instructions given to you by the office.    Take these medicines the morning of surgery with A SIP OF WATER                                 prozac, prilosec.     Do not wear jewelry, make-up or nail polish.  Do not wear lotions, powders, or perfumes, or deodorant.  Do not shave 48 hours prior to surgery.  Men may shave face and neck.  Do not bring valuables to the hospital.  [facility] is not responsible for any belongings or valuables.  Contacts, dentures or bridgework may not be worn into surgery.  Leave your suitcase in the car.  After surgery it may be brought to your room.  For patients admitted to the hospital, discharge time will be determined by your treatment team.  Patients discharged the day of surgery will not be allowed to drive home and must have someone with them for 24 hours.    Special instructions:    DO NOT smoke tobacco or vape for 24 hours before your procedure.  Please read over the following fact sheets that you were given. Anesthesia Post-op Instructions and Care and Recovery After Surgery      Colonoscopy, Adult, Care After This sheet gives you information about how to care for yourself after your procedure. Your health care provider may also give you more specific instructions. If you have problems or questions, contact your health careprovider. What can I expect after the procedure? After the procedure, it is common to have: A small amount of blood in your stool for 24 hours after the procedure. Some gas. Mild cramping or bloating of your abdomen. Follow these instructions at home: Eating and drinking  Drink enough fluid to keep your urine pale yellow. Follow instructions from your  health care provider about eating or drinking restrictions. Resume your normal diet as instructed by your health care provider. Avoid heavy or fried foods that are hard to digest.  Activity Rest as told by your health care provider. Avoid sitting for a long time without moving. Get up to take short walks every 1-2 hours. This is important to improve blood flow and breathing. Ask for help if you feel weak or unsteady. Return to your normal activities as told by your health care provider. Ask your health care provider what activities are safe for you. Managing cramping and bloating  Try walking around when you have cramps or feel bloated. Apply heat to your abdomen as told by your health care provider. Use the heat source that your health care provider recommends, such as a moist heat pack or a heating pad. Place a towel between your skin and the heat source. Leave the heat on for 20-30 minutes. Remove the heat if your skin turns bright red. This is especially important if you are unable to feel pain, heat, or cold. You may have a greater risk of getting burned.  General instructions If you were given a sedative during the procedure, it can affect you for several hours. Do not drive  or operate machinery until your health care provider says that it is safe. For the first 24 hours after the procedure: Do not sign important documents. Do not drink alcohol. Do your regular daily activities at a slower pace than normal. Eat soft foods that are easy to digest. Take over-the-counter and prescription medicines only as told by your health care provider. Keep all follow-up visits as told by your health care provider. This is important. Contact a health care provider if: You have blood in your stool 2-3 days after the procedure. Get help right away if you have: More than a small spotting of blood in your stool. Large blood clots in your stool. Swelling of your abdomen. Nausea or vomiting. A  fever. Increasing pain in your abdomen that is not relieved with medicine. Summary After the procedure, it is common to have a small amount of blood in your stool. You may also have mild cramping and bloating of your abdomen. If you were given a sedative during the procedure, it can affect you for several hours. Do not drive or operate machinery until your health care provider says that it is safe. Get help right away if you have a lot of blood in your stool, nausea or vomiting, a fever, or increased pain in your abdomen. This information is not intended to replace advice given to you by your health care provider. Make sure you discuss any questions you have with your healthcare provider. Document Revised: 12/25/2018 Document Reviewed: 07/27/2018 Elsevier Patient Education  [place] After This sheet gives you information about how to care for yourself after your procedure. Your health care provider may also give you more specific instructions. If you have problems or questions, contact your health careprovider. What can I expect after the procedure? After the procedure, it is common to have: Tiredness. Forgetfulness about what happened after the procedure. Impaired judgment for important decisions. Nausea or vomiting. Some difficulty with balance. Follow these instructions at home: For the time period you were told by your health care provider:     Rest as needed. Do not participate in activities where you could fall or become injured. Do not drive or use machinery. Do not drink alcohol. Do not take sleeping pills or medicines that cause drowsiness. Do not make important decisions or sign legal documents. Do not take care of children on your own. Eating and drinking Follow the diet that is recommended by your health care provider. Drink enough fluid to keep your urine pale yellow. If you vomit: Drink water, juice, or soup when you can drink  without vomiting. Make sure you have little or no nausea before eating solid foods. General instructions Have a responsible adult stay with you for the time you are told. It is important to have someone help care for you until you are awake and alert. Take over-the-counter and prescription medicines only as told by your health care provider. If you have sleep apnea, surgery and certain medicines can increase your risk for breathing problems. Follow instructions from your health care provider about wearing your sleep device: Anytime you are sleeping, including during daytime naps. While taking prescription pain medicines, sleeping medicines, or medicines that make you drowsy. Avoid smoking. Keep all follow-up visits as told by your health care provider. This is important. Contact a health care provider if: You keep feeling nauseous or you keep vomiting. You feel light-headed. You are still sleepy or having trouble with balance after 24 hours.  You develop a rash. You have a fever. You have redness or swelling around the IV site. Get help right away if: You have trouble breathing. You have new-onset confusion at home. Summary For several hours after your procedure, you may feel tired. You may also be forgetful and have poor judgment. Have a responsible adult stay with you for the time you are told. It is important to have someone help care for you until you are awake and alert. Rest as told. Do not drive or operate machinery. Do not drink alcohol or take sleeping pills. Get help right away if you have trouble breathing, or if you suddenly become confused. This information is not intended to replace advice given to you by your health care provider. Make sure you discuss any questions you have with your healthcare provider. Document Revised: 09/16/2019 Document Reviewed: 12/03/2018 Elsevier Patient Education  2022 [employer].

## 2020-08-31 ENCOUNTER — Other Ambulatory Visit: Payer: Self-pay

## 2020-08-31 ENCOUNTER — Encounter (HOSPITAL_COMMUNITY): Payer: Self-pay

## 2020-08-31 ENCOUNTER — Encounter (HOSPITAL_COMMUNITY)
Admission: RE | Admit: 2020-08-31 | Discharge: 2020-08-31 | Disposition: A | Discharge status: Home / Self Care | Payer: BC Managed Care – PPO | Source: Ambulatory Visit | Attending: Internal Medicine | Admitting: Internal Medicine

## 2020-09-04 ENCOUNTER — Encounter (HOSPITAL_COMMUNITY): Payer: Self-pay

## 2020-09-04 ENCOUNTER — Ambulatory Visit (HOSPITAL_COMMUNITY): Payer: BC Managed Care – PPO | Admitting: Anesthesiology

## 2020-09-04 ENCOUNTER — Ambulatory Visit (HOSPITAL_COMMUNITY)
Admission: RE | Admit: 2020-09-04 | Discharge: 2020-09-04 | Disposition: A | Discharge status: Home / Self Care | Payer: BC Managed Care – PPO | Attending: Internal Medicine | Admitting: Internal Medicine

## 2020-09-04 ENCOUNTER — Ambulatory Visit: Payer: BC Managed Care – PPO | Admitting: Gastroenterology

## 2020-09-04 ENCOUNTER — Encounter (HOSPITAL_COMMUNITY)
Admission: RE | Disposition: A | Discharge status: Home / Self Care | Payer: Self-pay | Source: Home / Self Care | Attending: Internal Medicine

## 2020-09-04 ENCOUNTER — Other Ambulatory Visit: Payer: Self-pay

## 2020-09-04 DIAGNOSIS — K635 Polyp of colon: Secondary | ICD-10-CM | POA: Diagnosis not present

## 2020-09-04 DIAGNOSIS — Z9049 Acquired absence of other specified parts of digestive tract: Secondary | ICD-10-CM | POA: Insufficient documentation

## 2020-09-04 DIAGNOSIS — Z79899 Other long term (current) drug therapy: Secondary | ICD-10-CM | POA: Insufficient documentation

## 2020-09-04 DIAGNOSIS — K625 Hemorrhage of anus and rectum: Secondary | ICD-10-CM

## 2020-09-04 DIAGNOSIS — K5730 Diverticulosis of large intestine without perforation or abscess without bleeding: Secondary | ICD-10-CM | POA: Diagnosis not present

## 2020-09-04 DIAGNOSIS — K648 Other hemorrhoids: Secondary | ICD-10-CM | POA: Diagnosis not present

## 2020-09-04 DIAGNOSIS — K529 Noninfective gastroenteritis and colitis, unspecified: Secondary | ICD-10-CM | POA: Diagnosis not present

## 2020-09-04 DIAGNOSIS — D124 Benign neoplasm of descending colon: Secondary | ICD-10-CM | POA: Diagnosis not present

## 2020-09-04 HISTORY — PX: BIOPSY: SHX5522

## 2020-09-04 HISTORY — PX: COLONOSCOPY WITH PROPOFOL: SHX5780

## 2020-09-04 SURGERY — COLONOSCOPY WITH PROPOFOL
Anesthesia: General

## 2020-09-04 MED ORDER — LACTATED RINGERS IV SOLN: INTRAVENOUS | Status: DC

## 2020-09-04 MED ORDER — PROPOFOL 10 MG/ML IV BOLUS
INTRAVENOUS | Status: DC | PRN
  Administered 2020-09-04: 30 mg via INTRAVENOUS
  Administered 2020-09-04: 70 mg via INTRAVENOUS

## 2020-09-04 MED ORDER — PROPOFOL 500 MG/50ML IV EMUL
INTRAVENOUS | Status: DC | PRN
  Administered 2020-09-04: 100 ug/kg/min via INTRAVENOUS

## 2020-09-04 MED ORDER — MIDAZOLAM HCL 2 MG/2ML IJ SOLN
INTRAMUSCULAR | Status: DC | PRN
  Administered 2020-09-04: 2 mg via INTRAVENOUS

## 2020-09-04 MED ORDER — MIDAZOLAM HCL 2 MG/2ML IJ SOLN
INTRAMUSCULAR | Status: AC
  Filled 2020-09-04: qty 2

## 2020-09-04 NOTE — H&P (Signed)
Primary Care Physician:  Manon Hilding, MD Primary Gastroenterologist:  Dr. Abbey Chatters  Pre-Procedure History & Physical: HPI:  [name] is a 65 y.o. female is here for a colonoscopy to be performed for colitis, rectal bleeding, diarrhea.    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   GERD (gastroesophageal reflux disease)    History of kidney stones 07/2014   Plantar fascial fibromatosis of left foot    Sinus problem     Past Surgical History:  Procedure Laterality Date   CARPAL TUNNEL RELEASE Right 10/04/2014   Procedure: RIGHT CARPAL TUNNEL RELEASE;  Surgeon: Daryll Brod, MD;  Location: [place];  Service: Orthopedics;  Laterality: Right;  REG/FAB   CARPAL TUNNEL RELEASE Left 11/15/2014   Procedure: LEFT CARPAL TUNNEL RELEASE;  Surgeon: Daryll Brod, MD;  Location: [place];  Service: Orthopedics;  Laterality: Left;   CERVICAL CONIZATION W/BX  04/2014   CHOLECYSTECTOMY  11/30/2014   DILATION AND CURETTAGE OF UTERUS  04/2014   DILATION AND CURETTAGE OF UTERUS N/A 08/10/2019   Procedure: DILATATION AND CURETTAGE OF THE UTERUS UNDER ULTRASOUND GUIDANCE;  Surgeon: [name], MD;  Location: [place];  Service: Gynecology;  Laterality: N/A;   HYMENECTOMY  age 23 or age 80   hysteroscoptic insertion of mirena IUD  08/2015   HYSTEROSCOPY  02/2014   INTRAUTERINE DEVICE (IUD) INSERTION N/A 08/10/2019   Procedure: INTRAUTERINE DEVICE (IUD) REMOVAL AND REINSERTION;  Surgeon: [name], MD;  Location: [place];  Service: Gynecology;  Laterality: N/A;  PHARMACY MIRENA IUD   OPERATIVE ULTRASOUND N/A 08/10/2019   Procedure: OPERATIVE ULTRASOUND;  Surgeon: [name], MD;  Location: [facility];  Service: Gynecology;  Laterality: N/A;   uterine polyps removed  06/03/2018   VARICOSE VEIN SURGERY Bilateral 15 yrs ago   tx with  injections only no sx   WISDOM TOOTH EXTRACTION     as a teenager    Prior to Admission medications   Medication Sig Start Date End Date Taking? Authorizing Provider  acetaminophen (TYLENOL) 500 MG tablet Take 1,000 mg by mouth See admin instructions. Take 1000 mg in the morning, may take an additional 1000 mg every 6 hours as needed for pain   Yes [provider]  Biotin 1000 MCG tablet Take 1,000 mcg by mouth daily.   Yes [provider]  COLLAGEN PO Take 1,000 mg by mouth daily.   Yes [provider]  diclofenac Sodium (VOLTAREN) 1 % GEL Apply 1 application topically 2 (two) times daily.   Yes [provider]  FLUoxetine (PROZAC) 20 MG tablet Take 20 mg by mouth daily.   Yes [provider]  omeprazole (PRILOSEC) 20 MG capsule Take 20 mg by mouth 2 (two) times daily before a meal.   Yes [provider]    Allergies as of 07/05/2020   (No Known Allergies)    Family History  Problem Relation Age of Onset   Diabetes Mother    Stroke Mother    Heart disease Father    Cancer Maternal Aunt        Breast   Colon cancer Neg Hx    Gastric cancer Neg Hx    Esophageal cancer Neg Hx     Social History   Socioeconomic History   Marital status: Widowed  Spouse name: Not on file   Number of children: Not on file   Years of education: Not on file   Highest education level: Not on file  Occupational History   Not on file  Tobacco Use   Smoking status: Never   Smokeless tobacco: Never  Vaping Use   Vaping Use: Never used  Substance and Sexual Activity   Alcohol use: No   Drug use: No   Sexual activity: Not Currently  Other Topics Concern   Not on file  Social History Narrative   Not on file   Social Determinants of Health   Financial Resource Strain: Not on file  Food Insecurity: Not on file  Transportation Needs: Not on file  Physical Activity: Not on file  Stress: Not on file  Social Connections: Not on file   Intimate Partner Violence: Not on file    Review of Systems: See HPI, otherwise negative ROS  Physical Exam: Vital signs in last 24 hours: Temp:  [98 F (36.7 C)] 98 F (36.7 C) (08/22 0643) Pulse Rate:  [71] 71 (08/22 0643) Resp:  [22] 22 (08/22 0643) BP: (134)/(57) 134/57 (08/22 0643) SpO2:  [96 %] 96 % (08/22 0643)   General:   Alert,  Well-developed, well-nourished, pleasant and cooperative in NAD Head:  Normocephalic and atraumatic. Eyes:  Sclera clear, no icterus.   Conjunctiva pink. Ears:  Normal auditory acuity. Nose:  No deformity, discharge,  or lesions. Mouth:  No deformity or lesions, dentition normal. Neck:  Supple; no masses or thyromegaly. Lungs:  Clear throughout to auscultation.   No wheezes, crackles, or rhonchi. No acute distress. Heart:  Regular rate and rhythm; no murmurs, clicks, rubs,  or gallops. Abdomen:  Soft, nontender and nondistended. No masses, hepatosplenomegaly or hernias noted. Normal bowel sounds, without guarding, and without rebound.   Msk:  Symmetrical without gross deformities. Normal posture. Extremities:  Without clubbing or edema. Neurologic:  Alert and  oriented x4;  grossly normal neurologically. Skin:  Intact without significant lesions or rashes. Cervical Nodes:  No significant cervical adenopathy. Psych:  Alert and cooperative. Normal mood and affect.  Impression/Plan: [name] is here for a colonoscopy to be performed for colitis, rectal bleeding, diarrhea.   The risks of the procedure including infection, bleed, or perforation as well as benefits, limitations, alternatives and imponderables have been reviewed with the patient. Questions have been answered. All parties agreeable.

## 2020-09-04 NOTE — Anesthesia Preprocedure Evaluation (Addendum)
Anesthesia Evaluation  Patient identified by MRN, date of birth, ID band Patient awake    Reviewed: Allergy & Precautions, NPO status , Patient's Chart, lab work & pertinent test results  History of Anesthesia Complications (+) history of anesthetic complications (alopecia after anesthesia/procedures after IUD placement)  Airway Mallampati: II  TM Distance: >3 FB Neck ROM: Full    Dental  (+) Dental Advisory Given, Teeth Intact   Pulmonary neg pulmonary ROS,    Pulmonary exam normal breath sounds clear to auscultation       Cardiovascular Exercise Tolerance: Good Normal cardiovascular exam Rhythm:Regular Rate:Normal     Neuro/Psych Anxiety  Neuromuscular disease    GI/Hepatic Neg liver ROS, GERD  Medicated and Controlled,  Endo/Other  Morbid obesity  Renal/GU negative Renal ROS     Musculoskeletal  (+) Arthritis ,   Abdominal   Peds  Hematology negative hematology ROS (+)   Anesthesia Other Findings alopecia after anesthesia/procedures after IUD placement  Reproductive/Obstetrics negative OB ROS                           Anesthesia Physical Anesthesia Plan  ASA: 3  Anesthesia Plan: General   Post-op Pain Management:    Induction: Intravenous  PONV Risk Score and Plan: Propofol infusion  Airway Management Planned: Nasal Cannula and Natural Airway  Additional Equipment:   Intra-op Plan:   Post-operative Plan:   Informed Consent: I have reviewed the patients History and Physical, chart, labs and discussed the procedure including the risks, benefits and alternatives for the proposed anesthesia with the patient or authorized representative who has indicated his/her understanding and acceptance.     Dental advisory given  Plan Discussed with: CRNA and Surgeon  Anesthesia Plan Comments:        Anesthesia Quick Evaluation

## 2020-09-04 NOTE — Transfer of Care (Signed)
Immediate Anesthesia Transfer of Care Note  Patient: [name]  Procedure(s) Performed: COLONOSCOPY WITH PROPOFOL BIOPSY  Patient Location: Short Stay  Anesthesia Type:General  Level of Consciousness: awake  Airway & Oxygen Therapy: Patient Spontanous Breathing  Post-op Assessment: Report given to RN and Post -op Vital signs reviewed and stable  Post vital signs: Reviewed and stable  Last Vitals:  Vitals Value Taken Time  BP    Temp    Pulse    Resp    SpO2      Last Pain:  Vitals:   09/04/20 0741  TempSrc:   PainSc: 4       Patients Stated Pain Goal: 5 ([id number])  Complications: No notable events documented.

## 2020-09-04 NOTE — Op Note (Signed)
[facility] Patient Name: [name] Procedure Date: 09/04/2020 7:08 AM MRN: [id number] Date of Birth: 04/10/1955 Attending MD: Elon Alas. Abbey Chatters DO CSN: [id number] Age: 65 Admit Type: Outpatient Procedure:                Colonoscopy Indications:              Rectal bleeding, Follow-up of colitis, Diarrhea Providers:                Elon Alas. Abbey Chatters, DO, Janeece Riggers, RN, Aram Candela Referring MD:              Medicines:                See the Anesthesia note for documentation of the                            administered medications Complications:            No immediate complications. Estimated Blood Loss:     Estimated blood loss was minimal. Procedure:                Pre-Anesthesia Assessment:                           - The anesthesia plan was to use monitored                            anesthesia care (MAC).                           After obtaining informed consent, the colonoscope                            was passed under direct vision. Throughout the                            procedure, the patient's blood pressure, pulse, and                            oxygen saturations were monitored continuously. The                            [id number]-HQ190L ([id number]) scope was introduced through                            the anus and advanced to the the cecum, identified                            by appendiceal orifice and ileocecal valve. The                            colonoscopy was performed without difficulty. The                            patient tolerated the procedure well. The quality  of the bowel preparation was evaluated using the                            BBPS [facility] Bowel Preparation Scale) with scores                            of: Right Colon = 3, Transverse Colon = 3 and Left                            Colon = 3 (entire mucosa seen well with no residual                            staining, small fragments of stool or opaque                             liquid). The total BBPS score equals 9. Scope In: 7:45:29 AM Scope Out: 7:56:03 AM Scope Withdrawal Time: 0 hours 8 minutes 33 seconds  Total Procedure Duration: 0 hours 10 minutes 34 seconds  Findings:      The perianal and digital rectal examinations were normal.      Non-bleeding internal hemorrhoids were found during endoscopy.      Multiple small-mouthed diverticula were found in the sigmoid colon.      A 2 mm polyp was found in the descending colon. The polyp was sessile.       The polyp was removed with a cold biopsy forceps. Resection and       retrieval were complete.      Focal Localized mild inflammation characterized by erythema was found in       the sigmoid colon at approx 32 cm ferom the anal verge. Biopsies were       taken with a cold forceps for histology. Impression:               - Non-bleeding internal hemorrhoids.                           - Diverticulosis in the sigmoid colon.                           - One 2 mm polyp in the descending colon, removed                            with a cold biopsy forceps. Resected and retrieved.                           - Localized mild inflammation was found in the                            sigmoid colon secondary to colitis. Biopsied. Moderate Sedation:      Per Anesthesia Care Recommendation:           - Patient has a contact number available for                            emergencies. The signs and symptoms of potential  delayed complications were discussed with the                            patient. Return to normal activities tomorrow.                            Written discharge instructions were provided to the                            patient.                           - Resume previous diet.                           - Continue present medications.                           - Await pathology results.                           - Repeat colonoscopy in 5 years for  surveillance.                           - Return to GI clinic in 6 months.                           - No ibuprofen, naproxen, or other non-steroidal                            anti-inflammatory drugs.                           - Colitis almost completely resolved compared to                            prior. If patient has resurgence of her cymptoms,                            would recommend CTA abdomen/pelvis to rule out                            ischemic colitis. Procedure Code(s):        --- Professional ---                           [phone], Colonoscopy, flexible; with biopsy, single                            or multiple Diagnosis Code(s):        --- Professional ---                           K64.8, Other hemorrhoids                           K63.5, Polyp of colon  K52.9, Noninfective gastroenteritis and colitis,                            unspecified                           K62.5, Hemorrhage of anus and rectum                           R19.7, Diarrhea, unspecified                           K57.30, Diverticulosis of large intestine without                            perforation or abscess without bleeding CPT copyright 2019 American Medical Association. All rights reserved. The codes documented in this report are preliminary and upon coder review may  be revised to meet current compliance requirements. Elon Alas. Abbey Chatters, DO [place] Abbey Chatters, DO 09/04/2020 8:06:00 AM This report has been signed electronically. Number of Addenda: 0

## 2020-09-04 NOTE — Anesthesia Postprocedure Evaluation (Signed)
Anesthesia Post Note  Patient: [name]  Procedure(s) Performed: COLONOSCOPY WITH PROPOFOL BIOPSY  Patient location during evaluation: Phase II Anesthesia Type: General Level of consciousness: awake and alert and oriented Pain management: pain level controlled Vital Signs Assessment: post-procedure vital signs reviewed and stable Respiratory status: spontaneous breathing and respiratory function stable Cardiovascular status: blood pressure returned to baseline and stable Postop Assessment: no apparent nausea or vomiting Anesthetic complications: no   No notable events documented.   Last Vitals:  Vitals:   09/04/20 0643 09/04/20 0802  BP: (!) 134/57 (!) 121/55  Pulse: 71 79  Resp: (!) 22 (!) 22  Temp: 36.7 C 36.6 C  SpO2: 96% 100%    Last Pain:  Vitals:   09/04/20 0802  TempSrc: Oral  PainSc: 5                  Rustyn Conery C Angell Pincock

## 2020-09-04 NOTE — Discharge Instructions (Signed)
  Colonoscopy Discharge Instructions  Read the instructions outlined below and refer to this sheet in the next few weeks. These discharge instructions provide you with general information on caring for yourself after you leave the hospital. Your doctor may also give you specific instructions. While your treatment has been planned according to the most current medical practices available, unavoidable complications occasionally occur.   ACTIVITY You may resume your regular activity, but move at a slower pace for the next 24 hours.  Take frequent rest periods for the next 24 hours.  Walking will help get rid of the air and reduce the bloated feeling in your belly (abdomen).  No driving for 24 hours (because of the medicine (anesthesia) used during the test).   Do not sign any important legal documents or operate any machinery for 24 hours (because of the anesthesia used during the test).  NUTRITION Drink plenty of fluids.  You may resume your normal diet as instructed by your doctor.  Begin with a light meal and progress to your normal diet. Heavy or fried foods are harder to digest and may make you feel sick to your stomach (nauseated).  Avoid alcoholic beverages for 24 hours or as instructed.  MEDICATIONS You may resume your normal medications unless your doctor tells you otherwise.  WHAT YOU CAN EXPECT TODAY Some feelings of bloating in the abdomen.  Passage of more gas than usual.  Spotting of blood in your stool or on the toilet paper.  IF YOU HAD POLYPS REMOVED DURING THE COLONOSCOPY: No aspirin products for 7 days or as instructed.  No alcohol for 7 days or as instructed.  Eat a soft diet for the next 24 hours.  FINDING OUT THE RESULTS OF YOUR TEST Not all test results are available during your visit. If your test results are not back during the visit, make an appointment with your caregiver to find out the results. Do not assume everything is normal if you have not heard from your  caregiver or the medical facility. It is important for you to follow up on all of your test results.  SEEK IMMEDIATE MEDICAL ATTENTION IF: You have more than a spotting of blood in your stool.  Your belly is swollen (abdominal distention).  You are nauseated or vomiting.  You have a temperature over 101.  You have abdominal pain or discomfort that is severe or gets worse throughout the day.   Your colonoscopy looked much improved.  There was a focal area of colitis though this was only 1 to 2 cm versus 15 cm seen on prior colonoscopy.  I think your colitis is almost completely resolved.  Continue to avoid NSAIDs.  I did take repeat biopsies today.  You also had 1 polyp which I removed successfully.  Recommend repeat colonoscopy in 5 years.  Follow-up with GI in 6 months.  I hope you have a great rest of your week!  Elon Alas. Abbey Chatters, D.O. Gastroenterology and Hepatology [facility]

## 2020-09-05 LAB — SURGICAL PATHOLOGY

## 2020-09-13 ENCOUNTER — Encounter (HOSPITAL_COMMUNITY): Payer: Self-pay | Admitting: Internal Medicine

## 2021-01-17 ENCOUNTER — Encounter: Payer: Self-pay | Admitting: Gastroenterology

## 2021-01-17 ENCOUNTER — Ambulatory Visit (INDEPENDENT_AMBULATORY_CARE_PROVIDER_SITE_OTHER): Payer: Medicare HMO | Admitting: Gastroenterology

## 2021-01-17 ENCOUNTER — Other Ambulatory Visit: Payer: Self-pay

## 2021-01-17 DIAGNOSIS — K529 Noninfective gastroenteritis and colitis, unspecified: Secondary | ICD-10-CM

## 2021-01-17 NOTE — Progress Notes (Signed)
Primary Care Physician: Manon Hilding, MD  Primary Gastroenterologist:  Elon Alas. Abbey Chatters, DO   Chief Complaint  Patient presents with   Follow-up    Doing fine. Pp fu. No blood in several months    HPI: [name] is a 66 y.o. female here for follow up.   Patient has a history of colitis involving segment of colon from 25cm to 40cm proximal to the anal verge in 12/2019 in [place] when she presented with bloody diarrhea.  Subsequent biopsies showed colitis most consistent with infectious versus inflammatory.  Patient was taking Aleve twice daily prior to that time. Since her colonoscopy she has stopped all NSAIDs and is only taking as needed Tylenol.  She also had an EGD at that time showing multiple gastric polyps, gastritis.  Gastric urease for H. pylori was negative.  She completed follow-up colonoscopy August 2022 by Dr. Abbey Chatters for follow-up of colitis.  She had localized mild inflammation in the sigmoid colon (32 cm from the anal verge), colitis almost completely resolved compared to prior, diverticulosis, nonbleeding internal hemorrhoids, 2 mm polyp removed from the descending colon.  Left colon biopsy showed mildly active chronic nonspecific colitis, negative for granulomas or dysplasia.  Differential diagnosis includes segmental colitis associated with diverticulitis, drug effect, ischemia.  Colon polyp was a tubular adenoma.  Recommend 5-year surveillance colonoscopy.  Patient advised to continue to avoid NSAIDs.  If she had recurrence of symptoms, she would call the office and we would order CT angio abdomen and pelvis.  Overall she has been doing well.  No further blood per rectum since August 2022.  She has to be really careful with her diet however.  Certain foods will cause left lower quadrant abdominal pain followed by episode of diarrhea.  Typically tries to avoid legumes, nuts, popcorn, tomatoes.  Most of the time has 1 bowel movement a day, soft serve consistency.  When  she eats out she has to be very cautious about what she eats in order to avoid having abdominal cramping and diarrhea.  She watches her diet she does pretty well.  Notes that over the holidays she has had a little more issues with abdominal pain and loose stool but she contributes this to her diet. Remembers back in her 82s and 40s, sometimes would have left lower quadrant pain with diarrhea related to certain foods.  Patient states she had been on Aleve twice daily for over 7 years but several months before she developed bloody diarrhea, she had increased her dosage, was under a lot of stress and felt like this may have exacerbated her symptoms.  Denies unintentional weight loss.  Reflux well controlled        Current Outpatient Medications  Medication Sig Dispense Refill   acetaminophen (TYLENOL) 500 MG tablet Take 1,000 mg by mouth See admin instructions. Take 1000 mg in the morning, may take an additional 1000 mg every 6 hours as needed for pain     Biotin 1000 MCG tablet Take 1,000 mcg by mouth daily.     COLLAGEN PO Take 1,000 mg by mouth daily.     diclofenac Sodium (VOLTAREN) 1 % GEL Apply 1 application topically 2 (two) times daily.     FLUoxetine (PROZAC) 20 MG tablet Take 20 mg by mouth daily.     omeprazole (PRILOSEC) 20 MG capsule Take 20 mg by mouth 2 (two) times daily before a meal.     No current facility-administered medications for this  visit.    Allergies as of 01/17/2021   (No Known Allergies)    ROS:  General: Negative for anorexia, weight loss, fever, chills, fatigue, weakness. ENT: Negative for hoarseness, difficulty swallowing , nasal congestion. CV: Negative for chest pain, angina, palpitations, dyspnea on exertion, peripheral edema.  Respiratory: Negative for dyspnea at rest, dyspnea on exertion, cough, sputum, wheezing.  GI: See history of present illness. GU:  Negative for dysuria, hematuria, urinary incontinence, urinary frequency, nocturnal urination.   Endo: Negative for unusual weight change.    Physical Examination:   BP (!) 148/79    Pulse 69    Temp 97.7 F (36.5 C)    Ht 5\' 2"  (1.575 m)    Wt 249 lb 12.8 oz (113.3 kg)    BMI 45.69 kg/m   General: Well-nourished, well-developed in no acute distress.  Eyes: No icterus. Mouth: masked Lungs: Clear to auscultation bilaterally.  Heart: Regular rate and rhythm, no murmurs rubs or gallops.  Abdomen: Bowel sounds are normal, nontender, nondistended, no hepatosplenomegaly or masses, no abdominal bruits or hernia , no rebound or guarding.   Extremities: No lower extremity edema. No clubbing or deformities. Neuro: Alert and oriented x 4   Skin: Warm and dry, no jaundice.   Psych: Alert and cooperative, normal mood and affect.  Labs:  Lab Results  Component Value Date   CREATININE 0.78 02/11/2020   BUN 16 02/11/2020   NA 140 02/11/2020   K 4.7 02/11/2020   CL 104 02/11/2020   CO2 27 02/11/2020   Lab Results  Component Value Date   ALT 8 02/11/2020   AST 11 02/11/2020   BILITOT 0.4 02/11/2020   Lab Results  Component Value Date   WBC 6.9 02/11/2020   HGB 12.8 02/11/2020   HCT 39.1 02/11/2020   MCV 94.0 02/11/2020   PLT 365 02/11/2020   Lab Results  Component Value Date   CRP 2.5 02/11/2020   Lab Results  Component Value Date   ESRSEDRATE 25 02/11/2020     Imaging Studies: No results found.   Assessment:  Left-sided colitis: Etiology not entirely clear.  Possibly NSAID related, other etiologies include ischemia, inflammatory.  Reassuringly her colitis was markedly improved compared to initial colonoscopy December 2021.  Clinically she has been doing much better off oral NSAIDs.  No unintentional weight loss.  No rectal bleeding in nearly 6 months.  We will continue to monitor for now.  Continue to avoid NSAIDs.  If she has recurrence of symptoms, she should let us know.  At that time would consider CTA abdomen and pelvis.  GERD: Continues to do well on current  regimen.  History of adenomatous colon polyps: Next colonoscopy due August 2027.   Plan: Monitor for recurrent symptoms (blood in stool, abdominal pain, persistent diarrhea). Continue omeprazole 20 mg twice daily before meals. Return to the office in 6 months or call sooner if needed.

## 2021-01-17 NOTE — Patient Instructions (Signed)
Great to see you today!  We will plan to see you back in six months for follow up. Please monitor for recurrent blood in the stools, abdominal pain, or persistent diarrhea. If you have any of these symptoms, please call and let us know.

## 2021-01-19 ENCOUNTER — Encounter: Payer: Self-pay | Admitting: Gastroenterology

## 2021-03-23 DIAGNOSIS — J069 Acute upper respiratory infection, unspecified: Secondary | ICD-10-CM | POA: Diagnosis not present

## 2021-03-23 DIAGNOSIS — J320 Chronic maxillary sinusitis: Secondary | ICD-10-CM | POA: Diagnosis not present

## 2021-03-23 DIAGNOSIS — Z6841 Body Mass Index (BMI) 40.0 and over, adult: Secondary | ICD-10-CM | POA: Diagnosis not present

## 2021-05-25 DIAGNOSIS — Z23 Encounter for immunization: Secondary | ICD-10-CM | POA: Diagnosis not present

## 2021-07-10 ENCOUNTER — Encounter: Payer: Self-pay | Admitting: Internal Medicine

## 2021-09-04 NOTE — Progress Notes (Unsigned)
GI Office Note    Referring Provider: Manon Hilding, MD Primary Care Physician:  Manon Hilding, MD  Primary Gastroenterologist: Elon Alas. Abbey Chatters, DO   Chief Complaint   No chief complaint on file.   History of Present Illness   [name] is a 66 y.o. female presenting today for follow up. She was last seen in 01/2021. She has history of GERD, colon polyps, and history of left-sided colitis (possibly NSAID related but other etiologies include ischemia and inflammatory). Initially seen on 12/2019 colonoscopy when she presented with bloody diarrhea. Follow up colonoscopy in 08/2020 with improvement off NSAIDs. Plans to consider CTA A/P if symptoms return. Next colonoscopy due in 08/2025 for adenomatous colon polyps.        Medications   Current Outpatient Medications  Medication Sig Dispense Refill   acetaminophen (TYLENOL) 500 MG tablet Take 1,000 mg by mouth See admin instructions. Take 1000 mg in the morning, may take an additional 1000 mg every 6 hours as needed for pain     Biotin 1000 MCG tablet Take 1,000 mcg by mouth daily.     COLLAGEN PO Take 1,000 mg by mouth daily.     diclofenac Sodium (VOLTAREN) 1 % GEL Apply 1 application topically 2 (two) times daily.     FLUoxetine (PROZAC) 20 MG tablet Take 20 mg by mouth daily.     omeprazole (PRILOSEC) 20 MG capsule Take 20 mg by mouth 2 (two) times daily before a meal.     No current facility-administered medications for this visit.    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   GERD (gastroesophageal reflux disease)    History of kidney stones 07/2014   Plantar fascial fibromatosis of left foot    Sinus problem     Past Surgical History   Past Surgical History:  Procedure Laterality Date   BIOPSY  09/04/2020   Procedure:  BIOPSY;  Surgeon: Eloise Harman, DO;  [facility];  Service: Endoscopy;;  descending colon polyp   CARPAL TUNNEL RELEASE Right 10/04/2014   Procedure: RIGHT CARPAL TUNNEL RELEASE;  Surgeon: Daryll Brod, MD;  Location: [place];  Service: Orthopedics;  Laterality: Right;  REG/FAB   CARPAL TUNNEL RELEASE Left 11/15/2014   Procedure: LEFT CARPAL TUNNEL RELEASE;  Surgeon: Daryll Brod, MD;  Location: [place];  Service: Orthopedics;  Laterality: Left;   CERVICAL CONIZATION W/BX  04/2014   CHOLECYSTECTOMY  11/30/2014   COLONOSCOPY WITH PROPOFOL N/A 09/04/2020   Procedure: COLONOSCOPY WITH PROPOFOL;  Surgeon: Eloise Harman, DO;  [facility]: [facility];  Service: Endoscopy;  Laterality: N/A;  7:30am   DILATION AND CURETTAGE OF UTERUS  04/2014   DILATION AND CURETTAGE OF UTERUS N/A 08/10/2019   Procedure: DILATATION AND CURETTAGE OF THE UTERUS UNDER ULTRASOUND GUIDANCE;  Surgeon: [name], MD;  Location: [place];  Service: Gynecology;  Laterality: N/A;   HYMENECTOMY  age 10 or age 89   hysteroscoptic insertion of mirena IUD  08/2015   HYSTEROSCOPY  02/2014   INTRAUTERINE DEVICE (IUD) INSERTION N/A 08/10/2019   Procedure: INTRAUTERINE DEVICE (IUD) REMOVAL AND REINSERTION;  Surgeon: [name], MD;  Location: [facility];  Service:  Gynecology;  Laterality: N/A;  PHARMACY MIRENA IUD   OPERATIVE ULTRASOUND N/A 08/10/2019   Procedure: OPERATIVE ULTRASOUND;  Surgeon: [name], MD;  Location: [facility];  Service: Gynecology;  Laterality: N/A;   uterine polyps removed  06/03/2018   VARICOSE VEIN SURGERY Bilateral 15 yrs ago   tx with injections only no sx   WISDOM TOOTH EXTRACTION     as a teenager    Past Family History   Family History  Problem Relation Age of Onset   Diabetes Mother    Stroke Mother    Heart disease Father    Cancer Maternal Aunt        Breast   Colon cancer Neg Hx    Gastric  cancer Neg Hx    Esophageal cancer Neg Hx     Past Social History   Social History   Socioeconomic History   Marital status: Widowed    Spouse name: Not on file   Number of children: Not on file   Years of education: Not on file   Highest education level: Not on file  Occupational History   Not on file  Tobacco Use   Smoking status: Never   Smokeless tobacco: Never  Vaping Use   Vaping Use: Never used  Substance and Sexual Activity   Alcohol use: No   Drug use: No   Sexual activity: Not Currently  Other Topics Concern   Not on file  Social History Narrative   Not on file   Social Determinants of Health   Financial Resource Strain: Not on file  Food Insecurity: Not on file  Transportation Needs: Not on file  Physical Activity: Not on file  Stress: Not on file  Social Connections: Not on file  Intimate Partner Violence: Not on file    Review of Systems   General: Negative for anorexia, weight loss, fever, chills, fatigue, weakness. ENT: Negative for hoarseness, difficulty swallowing , nasal congestion. CV: Negative for chest pain, angina, palpitations, dyspnea on exertion, peripheral edema.  Respiratory: Negative for dyspnea at rest, dyspnea on exertion, cough, sputum, wheezing.  GI: See history of present illness. GU:  Negative for dysuria, hematuria, urinary incontinence, urinary frequency, nocturnal urination.  Endo: Negative for unusual weight change.     Physical Exam   There were no vitals taken for this visit.   General: Well-nourished, well-developed in no acute distress.  Eyes: No icterus. Mouth: Oropharyngeal mucosa moist and pink , no lesions erythema or exudate. Lungs: Clear to auscultation bilaterally.  Heart: Regular rate and rhythm, no murmurs rubs or gallops.  Abdomen: Bowel sounds are normal, nontender, nondistended, no hepatosplenomegaly or masses,  no abdominal bruits or hernia , no rebound or guarding.  Rectal: ***  Extremities: No  lower extremity edema. No clubbing or deformities. Neuro: Alert and oriented x 4   Skin: Warm and dry, no jaundice.   Psych: Alert and cooperative, normal mood and affect.  Labs   *** Imaging Studies   No results found.  Assessment       PLAN   ***   Laureen Ochs. Bobby Rumpf, [place], [facility]

## 2021-09-05 ENCOUNTER — Encounter: Payer: Self-pay | Admitting: Gastroenterology

## 2021-09-05 ENCOUNTER — Ambulatory Visit (INDEPENDENT_AMBULATORY_CARE_PROVIDER_SITE_OTHER): Payer: Medicare HMO | Admitting: Gastroenterology

## 2021-09-05 VITALS — BP 133/80 | HR 79 | Temp 97.7°F | Ht 60.0 in | Wt 236.4 lb

## 2021-09-05 DIAGNOSIS — K529 Noninfective gastroenteritis and colitis, unspecified: Secondary | ICD-10-CM | POA: Diagnosis not present

## 2021-09-05 DIAGNOSIS — M2550 Pain in unspecified joint: Secondary | ICD-10-CM

## 2021-09-05 DIAGNOSIS — L659 Nonscarring hair loss, unspecified: Secondary | ICD-10-CM | POA: Diagnosis not present

## 2021-09-05 NOTE — Patient Instructions (Signed)
Please have labs at [place] at your convenience. I will be checking for Alpha-Gal, general inflammation, rheumatoid arthritis, lupus, vitamin D deficiency, thyroid function, anemia, liver and kidney function. We will be in touch with results as available. Please call and ask to speak to a nurse if you have another episode of abdominal cramping and blood in the stool. At that time would recommend CT to look at blood flow to your intestines.

## 2021-09-11 LAB — INTERPRETATION:

## 2021-09-11 LAB — ANTI-NUCLEAR AB-TITER (ANA TITER)
ANA TITER: 1:40 {titer} — ABNORMAL HIGH
ANA Titer 1: 1:80 {titer} — ABNORMAL HIGH

## 2021-09-11 LAB — COMPREHENSIVE METABOLIC PANEL
AG Ratio: 1.5 (calc) (ref 1.0–2.5)
ALT: 8 U/L (ref 6–29)
AST: 12 U/L (ref 10–35)
Albumin: 4.2 g/dL (ref 3.6–5.1)
Alkaline phosphatase (APISO): 54 U/L (ref 37–153)
BUN: 14 mg/dL (ref 7–25)
CO2: 23 mmol/L (ref 20–32)
Calcium: 9.4 mg/dL (ref 8.6–10.4)
Chloride: 106 mmol/L (ref 98–110)
Creat: 0.7 mg/dL (ref 0.50–1.05)
Globulin: 2.8 g/dL (calc) (ref 1.9–3.7)
Glucose, Bld: 85 mg/dL (ref 65–99)
Potassium: 4.4 mmol/L (ref 3.5–5.3)
Sodium: 140 mmol/L (ref 135–146)
Total Bilirubin: 0.5 mg/dL (ref 0.2–1.2)
Total Protein: 7 g/dL (ref 6.1–8.1)

## 2021-09-11 LAB — CBC WITH DIFFERENTIAL/PLATELET
Absolute Monocytes: 336 cells/uL (ref 200–950)
Basophils Absolute: 52 cells/uL (ref 0–200)
Basophils Relative: 0.9 %
Eosinophils Absolute: 139 cells/uL (ref 15–500)
Eosinophils Relative: 2.4 %
HCT: 36.8 % (ref 35.0–45.0)
Hemoglobin: 11.9 g/dL (ref 11.7–15.5)
Lymphs Abs: 1943 cells/uL (ref 850–3900)
MCH: 30.1 pg (ref 27.0–33.0)
MCHC: 32.3 g/dL (ref 32.0–36.0)
MCV: 92.9 fL (ref 80.0–100.0)
MPV: 9.3 fL (ref 7.5–12.5)
Monocytes Relative: 5.8 %
Neutro Abs: 3329 cells/uL (ref 1500–7800)
Neutrophils Relative %: 57.4 %
Platelets: 323 10*3/uL (ref 140–400)
RBC: 3.96 10*6/uL (ref 3.80–5.10)
RDW: 13.2 % (ref 11.0–15.0)
Total Lymphocyte: 33.5 %
WBC: 5.8 10*3/uL (ref 3.8–10.8)

## 2021-09-11 LAB — ANA, IFA COMPREHENSIVE PANEL
Anti Nuclear Antibody (ANA): POSITIVE — AB
ENA SM Ab Ser-aCnc: <1 AI
SM/RNP: <1 AI
SSA (Ro) (ENA) Antibody, IgG: <1 AI
SSB (La) (ENA) Antibody, IgG: <1 AI
Scleroderma (Scl-70) (ENA) Antibody, IgG: <1 AI
ds DNA Ab: <1 IU/mL

## 2021-09-11 LAB — TEST AUTHORIZATION

## 2021-09-11 LAB — ALPHA-GAL PANEL
Allergen, Mutton, f88: <0.1 kU/L
Allergen, Pork, f26: <0.1 kU/L
Beef: <0.1 kU/L
CLASS: 0
CLASS: 0
Class: 0
GALACTOSE-ALPHA-1,3-GALACTOSE IGE*: <0.1 kU/L (ref <0.10)

## 2021-09-11 LAB — SEDIMENTATION RATE: Sed Rate: 28 mm/h (ref 0–30)

## 2021-09-11 LAB — VITAMIN D 25 HYDROXY (VIT D DEFICIENCY, FRACTURES): Vit D, 25-Hydroxy: 9 ng/mL — ABNORMAL LOW (ref 30–100)

## 2021-09-11 LAB — TSH+FREE T4: TSH W/REFLEX TO FT4: 1.28 mIU/L (ref 0.40–4.50)

## 2021-09-11 LAB — RHEUMATOID FACTOR: Rheumatoid fact SerPl-aCnc: <14 IU/mL (ref <14)

## 2021-09-11 LAB — C-REACTIVE PROTEIN: CRP: 2.1 mg/L (ref <8.0)

## 2021-09-17 ENCOUNTER — Other Ambulatory Visit: Payer: Self-pay | Admitting: Gastroenterology

## 2021-09-17 MED ORDER — VITAMIN D (ERGOCALCIFEROL) 1.25 MG (50000 UNIT) PO CAPS: 50000.0000 [IU] | ORAL_CAPSULE | ORAL | 0 refills | Status: DC

## 2021-09-18 ENCOUNTER — Other Ambulatory Visit: Payer: Self-pay | Admitting: *Deleted

## 2021-09-18 ENCOUNTER — Other Ambulatory Visit: Payer: Self-pay

## 2021-09-18 DIAGNOSIS — R768 Other specified abnormal immunological findings in serum: Secondary | ICD-10-CM

## 2021-09-18 DIAGNOSIS — R7989 Other specified abnormal findings of blood chemistry: Secondary | ICD-10-CM

## 2021-09-25 DIAGNOSIS — Z23 Encounter for immunization: Secondary | ICD-10-CM | POA: Diagnosis not present

## 2021-11-12 DIAGNOSIS — Z23 Encounter for immunization: Secondary | ICD-10-CM | POA: Diagnosis not present

## 2021-12-11 NOTE — Progress Notes (Signed)
Office Visit Note  Patient: [name]             Date of Birth: [birth year]           MRN: [id number]             PCP: Manon Hilding, MD Referring: Westly Pam Visit Date: 12/12/2021 Occupation: Retired high school teacher  Subjective:  New Patient (Initial Visit) (Bl shoulder, bil hand and bil knee pain)   History of Present Illness: [name] is a 66 y.o. female here fgr evaluation of positive ANA and multiple joint pains. She has discontinued NSAIDs due to left sided colitis episodes without certain etiology established except suspected medication related. Has chronic joint pain worse now in multiple areas workup labs negative for RF, ESR, CRP, but low positive ANA 1:80 with negative ENA panel. She has also described hair loss in a generalized pattern and increased in longitudinal nail ridges these been worse during this recent time since the colitis symptoms.  She has had chronic joint pain in bilateral knees ongoing for years.  She estimates at least 5 or more years been self medicating with Aleve which is very beneficial for her joint pain and stiffness.  Initially taking daily and then had increased to twice daily consistently for much of this time.  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  She stopped this completely since her colitis problems and these have resolved but in addition to worsening of her knee pain and stiffness also developed trouble in her upper extremities and both shoulders and both hands especially the right shoulder.  Also abruptly developed a palpable knot and swelling in the right shoulder with decreased range of motion in the past year.  She does use a cane for stability and notices leaning on counters or carts more to offload her knees with prolonged standing or walking. She has not noticed any new problem with skin rashes, oral nasal ulcers, Raynaud's symptoms, no abnormal  bruising or bleeding outside of the colitis.  She does have dry eye symptoms these are worse when reading or on a screen for prolonged periods but does not require eyedrops for symptoms every day.  She was also found to have very low serum vitamin D level at lab testing earlier this year.  Activities of Daily Living:  Patient reports morning stiffness for 30 minutes.   Patient Reports nocturnal pain.  Difficulty dressing/grooming: Reports Difficulty climbing stairs: Reports Difficulty getting out of chair: Reports Difficulty using hands for taps, buttons, cutlery, and/or writing: Reports  Review of Systems  Constitutional:  Positive for fatigue.  HENT:  Negative for mouth sores and mouth dryness.   Eyes:  Negative for dryness.  Respiratory:  Negative for shortness of breath.   Cardiovascular:  Negative for chest pain and palpitations.  Gastrointestinal:  Negative for blood in stool, constipation and diarrhea.  Endocrine: Negative for increased urination.  Genitourinary:  Negative for involuntary urination.  Musculoskeletal:  Positive for joint pain, gait problem, joint pain, joint swelling, myalgias, morning stiffness and myalgias. Negative for muscle weakness and muscle tenderness.  Skin:  Positive for hair loss. Negative for color change, rash and sensitivity to sunlight.  Allergic/Immunologic: Negative for susceptible to infections.  Neurological:  Negative for dizziness and headaches.  Hematological:  Negative for swollen glands.  Psychiatric/Behavioral:  Positive for depressed mood. Negative for sleep disturbance. The patient  is nervous/anxious.     PMFS History:  Patient Active Problem List   Diagnosis Date Noted   Positive ANA (antinuclear antibody) 12/12/2021   Vitamin D deficiency 12/12/2021   Joint pain 09/05/2021   Multiple joint pain 09/05/2021   Hair loss 09/05/2021   Colitis 01/17/2021   Rectal bleeding 02/10/2020   Gastric polyps 02/10/2020   GERD  (gastroesophageal reflux disease) 02/10/2020   Diarrhea 02/10/2020   Postmenopausal bleeding 07/20/2019   Symptomatic cholelithiasis 11/27/2018   Carpal tunnel syndrome on left 12/14/2014   Endometrial 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, complex 04/20/2014    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   GERD (gastroesophageal reflux disease)    History of kidney stones 07/2014   Plantar fascial fibromatosis of left foot    Sinus problem     Family History  Problem Relation Age of Onset   Diabetes Mother    Stroke Mother    Osteoarthritis Mother    Heart disease Father    Cancer Maternal Aunt        Breast   Colon cancer Neg Hx    Gastric cancer Neg Hx    Esophageal cancer Neg Hx    Past Surgical History:  Procedure Laterality Date   BIOPSY  09/04/2020   Procedure: BIOPSY;  Surgeon: Eloise Harman, DO;  Location: [facility];  Service: Endoscopy;;  descending colon polyp   CARPAL TUNNEL RELEASE Right 10/04/2014   Procedure: RIGHT CARPAL TUNNEL RELEASE;  Surgeon: Daryll Brod, MD;  Location: [place];  Service: Orthopedics;  Laterality: Right;  REG/FAB   CARPAL TUNNEL RELEASE Left 11/15/2014   Procedure: LEFT CARPAL TUNNEL RELEASE;  Surgeon: Daryll Brod, MD;  Location: [place];  Service: Orthopedics;  Laterality: Left;   CERVICAL CONIZATION W/BX  04/2014   CHOLECYSTECTOMY  11/30/2014   COLONOSCOPY WITH PROPOFOL N/A 09/04/2020   Procedure: COLONOSCOPY WITH PROPOFOL;  Surgeon: Eloise Harman, DO;  Location: [facility];  Service: Endoscopy;  Laterality: N/A;  7:30am   DILATION AND CURETTAGE OF UTERUS  04/2014   DILATION AND CURETTAGE OF UTERUS N/A 08/10/2019   Procedure: DILATATION AND CURETTAGE OF THE UTERUS UNDER ULTRASOUND GUIDANCE;  Surgeon: [name], MD;  Location: [place];  Service: Gynecology;  Laterality: N/A;   HYMENECTOMY  age 4 or age  45   hysteroscoptic insertion of mirena IUD  08/2015   HYSTEROSCOPY  02/2014   INTRAUTERINE DEVICE (IUD) INSERTION N/A 08/10/2019   Procedure: INTRAUTERINE DEVICE (IUD) REMOVAL AND REINSERTION;  Surgeon: [name], MD;  Location: [place];  Service: Gynecology;  Laterality: N/A;  PHARMACY MIRENA IUD   OPERATIVE ULTRASOUND N/A 08/10/2019   Procedure: OPERATIVE ULTRASOUND;  Surgeon: [name], MD;  Location: [facility];  Service: Gynecology;  Laterality: N/A;   uterine polyps removed  06/03/2018   VARICOSE VEIN SURGERY Bilateral 15 yrs ago   tx with injections only no sx   WISDOM TOOTH EXTRACTION     as a teenager   Social History   Social History Narrative   Not on file    There is no immunization history on file for this patient.   Objective: Vital Signs: BP (!) 148/83 (BP Location: Left Arm, Patient Position: Sitting, Cuff Size: Normal)   Pulse 79   Resp 17   Ht 5' (1.524 m)  Wt 233 lb (105.7 kg)   BMI 45.50 kg/m    Physical Exam Constitutional:      Appearance: She is obese.  HENT:     Mouth/Throat:     Mouth: Mucous membranes are moist.     Pharynx: Oropharynx is clear.  Eyes:     Conjunctiva/sclera: Conjunctivae normal.  Cardiovascular:     Rate and Rhythm: Normal rate and regular rhythm.  Pulmonary:     Effort: Pulmonary effort is normal.     Breath sounds: Normal breath sounds.  Musculoskeletal:     Comments: Trace pedal edema b/l, tortuous superficial veins at distal legs and especially lateral ankle  Lymphadenopathy:     Cervical: No cervical adenopathy.  Skin:    General: Skin is warm and dry.  Neurological:     Mental Status: She is alert.  Psychiatric:        Mood and Affect: Mood normal.      Musculoskeletal Exam:  Shoulders restricted active and passive ROM bilaterally very poor abduction and external rotation, tenderness to pressure anteriorly on right shoulder worst near bicipital groove Elbows full ROM no  tenderness or swelling Wrists full ROM no tenderness or swelling Fingers full ROM no tenderness or swelling Knees ROM grossly intact medial joint line pain with full extension and guarding against full flexion, no palpable effusion, medial pain with varus pressure Ankles full ROM no tenderness or swelling 1st MTP bunions on both feet   Investigation: No additional findings.  Imaging: XR Shoulder Left  Result Date: 12/17/2021 X-ray left shoulder 4 views Severe loss of glenohumeral joint space with large inferior humeral osteophytes and sclerotic changes.  Acromiohumeral distance appears normal.  AC joint space is not ideally visualized but probable narrowing.  No erosions or abnormal calcifications seen. Impression Severe glenohumeral joint osteoarthritis  XR Shoulder Right  Result Date: 12/17/2021 X-ray right shoulder 4 views Severe glenohumeral joint osteoarthritis with joint space narrowing sclerosis and inferior bone spurring.  AC joint with is normal.  Acromiohumeral interval increased at greater than 25 mm.  No erosions or abnormal calcifications seen. Impression Moderate to severe glenohumeral joint osteoarthritis, inferior placement of humeral head consistent with displacement from large effusion or chronic dislocation  XR KNEE 3 VIEW LEFT  Result Date: 12/13/2021 X-ray left knee 3 views Severe osteoarthritis involving all compartments but more advanced in medial compartment.  There is varus deformity.  Prominent lateral osteophytes.  Patellofemoral joint space narrowing is mild with large patellar enthesophytes.  Large calcification or calcified free body in the suprapatellar space.  No erosions no visible joint effusion. Impression Severe medial compartment osteoarthritis less severe in the lateral patellofemoral compartments, associated joint varus deformity  XR KNEE 3 VIEW RIGHT  Result Date: 12/13/2021 X-ray right knee 3 views Severe tricompartmental osteoarthritis most severe  in the medial compartment with complete loss of joint space.  Patella positioning is normal more severe loss of patellofemoral space on medial side.  There are prominent calcifications or calcified free bodies present in the posterior joint space as well as suprapatellar space.  No erosive joint changes or large effusion seen. Impression Very severe osteoarthritis with multiple intra-articular and suprapatellar calcified bodies   Recent Labs: Lab Results  Component Value Date   WBC 5.8 09/05/2021   HGB 11.9 09/05/2021   PLT 323 09/05/2021   NA 140 09/05/2021   K 4.4 09/05/2021   CL 106 09/05/2021   CO2 23 09/05/2021   GLUCOSE 85 09/05/2021   BUN 14  09/05/2021   CREATININE 0.70 09/05/2021   BILITOT 0.5 09/05/2021   AST 12 09/05/2021   ALT 8 09/05/2021   PROT 7.0 09/05/2021   CALCIUM 9.4 09/05/2021   GFRAA >60 08/06/2019    Speciality Comments: No specialty comments available.  Procedures:  No procedures performed Allergies: Aleve [naproxen]   Assessment / Plan:     Visit Diagnoses: Multiple joint pain  Arthralgia, unspecified joint - Plan: XR KNEE 3 VIEW RIGHT, XR KNEE 3 VIEW LEFT, XR Shoulder Right, XR Shoulder Left  Joint pain in multiple areas particularly shoulders and knees problematic and with some decreased mobility on exam.  X-rays are checked primarily showing severe osteoarthritis right shoulder also has a inferiorly displaced positioning.  Not a good candidate for NSAIDs's already taken Tylenol which is partial improvement and Voltaren just partial improvement.  Might be candidate for trying addition of duloxetine with some caution on long-term Prozac 20 mg daily.  For the right shoulder problems will recommend at least point-of-care ultrasound exam and follow-up or if not improving MRI may be beneficial.  May also be a good candidate for trial of intra-articular steroid injection which has not been used recently for the joint problems.  Positive ANA (antinuclear  antibody) - Plan: C3 and C4, Cyclic citrul peptide antibody, IgG  Positive ANA but negative extractable nuclear antibodies panel with considerable joint pain in multiple areas.  Do not see clinical evidence for an active inflammatory disease at this time.  Will check CCP antibodies also serum complements aiming to rule out RA and lupus as best possible with low pretest disease suspicion.  Colitis  No longer a good candidate for NSAIDs for treatment of chronic joint pain due to colitis.  Not clinically active at this time.  Vitamin D deficiency - Plan: VITAMIN D 25 Hydroxy (Vit-D Deficiency, Fractures)  Rechecking vitamin D now she has completed a few months of supplementation and with history of severe arthritis in multiple areas.  Orders: Orders Placed This Encounter  Procedures   XR KNEE 3 VIEW RIGHT   XR KNEE 3 VIEW LEFT   XR Shoulder Right   XR Shoulder Left   C3 and C4   Cyclic citrul peptide antibody, IgG   VITAMIN D 25 Hydroxy (Vit-D Deficiency, Fractures)   No orders of the defined types were placed in this encounter.    Follow-Up Instructions: Return in about 2 weeks (around 12/26/2021) for New pt f/u OA/+ANA .   Collier Salina, MD  Note - This record has been created using [employer].  Chart creation errors have been sought, but may not always  have been located. Such creation errors do not reflect on  the standard of medical care.

## 2021-12-12 ENCOUNTER — Ambulatory Visit (INDEPENDENT_AMBULATORY_CARE_PROVIDER_SITE_OTHER): Payer: Medicare HMO

## 2021-12-12 ENCOUNTER — Encounter: Payer: Self-pay | Admitting: Internal Medicine

## 2021-12-12 ENCOUNTER — Ambulatory Visit: Payer: Medicare HMO

## 2021-12-12 ENCOUNTER — Ambulatory Visit: Payer: Medicare HMO | Attending: Internal Medicine | Admitting: Internal Medicine

## 2021-12-12 VITALS — BP 148/83 | HR 79 | Resp 17 | Ht 60.0 in | Wt 233.0 lb

## 2021-12-12 DIAGNOSIS — G5602 Carpal tunnel syndrome, left upper limb: Secondary | ICD-10-CM

## 2021-12-12 DIAGNOSIS — E559 Vitamin D deficiency, unspecified: Secondary | ICD-10-CM | POA: Diagnosis not present

## 2021-12-12 DIAGNOSIS — M2550 Pain in unspecified joint: Secondary | ICD-10-CM

## 2021-12-12 DIAGNOSIS — R768 Other specified abnormal immunological findings in serum: Secondary | ICD-10-CM | POA: Diagnosis not present

## 2021-12-12 DIAGNOSIS — M25512 Pain in left shoulder: Secondary | ICD-10-CM | POA: Diagnosis not present

## 2021-12-12 DIAGNOSIS — M25561 Pain in right knee: Secondary | ICD-10-CM

## 2021-12-12 DIAGNOSIS — M25562 Pain in left knee: Secondary | ICD-10-CM

## 2021-12-12 DIAGNOSIS — K529 Noninfective gastroenteritis and colitis, unspecified: Secondary | ICD-10-CM | POA: Diagnosis not present

## 2021-12-12 DIAGNOSIS — M25511 Pain in right shoulder: Secondary | ICD-10-CM

## 2021-12-12 NOTE — Patient Instructions (Signed)
For osteoarthritis several treatments may be beneficial:  - Topical antiinflammatory medicine such as diclofenac (Voltaren) can be applied to affected area as needed. Topical analgesics containing CBD, menthol, or lidocaine can be tried.  - Turmeric has some antiinflammatory effect similar to NSAIDs and may help, if taken as a supplement should not be taken above recommended doses.   - Compressive sleeve can be helpful to support the joint especially if hurting or swelling with certain activities.  - Physical therapy referral can discuss exercises or activity modification to improve symptoms or strength if needed.  - Local steroid injection is an option if symptoms become worse and not controlled by the above options.

## 2021-12-14 LAB — C3 AND C4
C3 Complement: 138 mg/dL (ref 83–193)
C4 Complement: 27 mg/dL (ref 15–57)

## 2021-12-14 LAB — VITAMIN D 25 HYDROXY (VIT D DEFICIENCY, FRACTURES): Vit D, 25-Hydroxy: 32 ng/mL (ref 30–100)

## 2021-12-14 LAB — CYCLIC CITRUL PEPTIDE ANTIBODY, IGG: Cyclic Citrullin Peptide Ab: <16 UNITS

## 2021-12-17 NOTE — Progress Notes (Signed)
Lab results were all normal which is reassuring and does not suggest lupus or rheumatoid arthritis is a problem.  Her current vitamin D supplementation is adequate I agree with her continuing 2000 units daily.  X-rays show severe osteoarthritis in her shoulders and knees.  The right shoulder looks like there are changes of either joint swelling or chronic injury that we need to take another look at follow-up.

## 2021-12-21 ENCOUNTER — Other Ambulatory Visit: Payer: Self-pay

## 2021-12-21 DIAGNOSIS — R7989 Other specified abnormal findings of blood chemistry: Secondary | ICD-10-CM

## 2021-12-28 ENCOUNTER — Telehealth: Payer: Self-pay

## 2021-12-28 NOTE — Telephone Encounter (Signed)
Pt called stating that she received lab orders in the mail for repeat vit d labs. Pt states that she had the same lab drawn by the rheumatologist on 12/12/2021. Results are in the patient's chart. Pt also wanted to let you know that the rheumatologist agrees with you on her current vit d dosage.

## 2022-01-02 NOTE — Telephone Encounter (Signed)
Pt was made aware and verbalized understanding.  

## 2022-01-02 NOTE — Telephone Encounter (Signed)
Reviewed labs. Agree with continue current vitamin D dose.

## 2022-01-03 ENCOUNTER — Ambulatory Visit: Payer: Medicare HMO | Attending: Internal Medicine | Admitting: Internal Medicine

## 2022-01-03 ENCOUNTER — Encounter: Payer: Self-pay | Admitting: Internal Medicine

## 2022-01-03 VITALS — BP 140/85 | HR 67 | Resp 17 | Ht 60.0 in | Wt 228.0 lb

## 2022-01-03 DIAGNOSIS — M159 Polyosteoarthritis, unspecified: Secondary | ICD-10-CM | POA: Diagnosis not present

## 2022-01-03 DIAGNOSIS — M2550 Pain in unspecified joint: Secondary | ICD-10-CM

## 2022-01-03 DIAGNOSIS — M25511 Pain in right shoulder: Secondary | ICD-10-CM | POA: Diagnosis not present

## 2022-01-03 DIAGNOSIS — M25562 Pain in left knee: Secondary | ICD-10-CM

## 2022-01-03 DIAGNOSIS — G8929 Other chronic pain: Secondary | ICD-10-CM

## 2022-01-03 NOTE — Progress Notes (Signed)
Office Visit Note  Patient: [name]             Date of Birth: 25-Jul-1955           MRN: [id number]             PCP: Manon Hilding, MD Referring: Manon Hilding, MD Visit Date: 01/03/2022   Subjective:   History of Present Illness: [name] is a 66 y.o. female here for follow up for osteoarthritis in multiple areas also right shoulder xray suggestive for some subacromial bursitis or chronic injury with displacement or dislocation. Symptoms remain about the same since our initial visit. Right shoulder is the worst and limited ROM is difficult to dress. Knee pain remains mot problematic along medial side of her joints.   Previous HPI 12/12/21 [name] is a 66 y.o. female here fgr evaluation of positive ANA and multiple joint pains. She has discontinued NSAIDs due to left sided colitis episodes without certain etiology established except suspected medication related. Has chronic joint pain worse now in multiple areas workup labs negative for RF, ESR, CRP, but low positive ANA 1:80 with negative ENA panel. She has also described hair loss in a generalized pattern and increased in longitudinal nail ridges these been worse during this recent time since the colitis symptoms.  She has had chronic joint pain in bilateral knees ongoing for years.  She estimates at least 5 or more years been self medicating with Aleve which is very beneficial for her joint pain and stiffness.  Initially taking daily and then had increased to twice daily consistently for much of this time.  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  She stopped this completely since her colitis problems and these have resolved but in addition to worsening of her knee pain and stiffness also developed trouble in her upper extremities and both shoulders and both hands especially the right shoulder.  Also abruptly developed a palpable knot and swelling in the  right shoulder with decreased range of motion in the past year.  She does use a cane for stability and notices leaning on counters or carts more to offload her knees with prolonged standing or walking. She has not noticed any new problem with skin rashes, oral nasal ulcers, Raynaud's symptoms, no abnormal bruising or bleeding outside of the colitis.  She does have dry eye symptoms these are worse when reading or on a screen for prolonged periods but does not require eyedrops for symptoms every day.  She was also found to have very low serum vitamin D level at lab testing earlier this year.   Review of Systems  Constitutional:  Negative for fatigue.  HENT:  Negative for mouth sores and mouth dryness.   Eyes:  Negative for dryness.  Respiratory:  Negative for shortness of breath.   Cardiovascular:  Negative for chest pain and palpitations.  Gastrointestinal:  Negative for blood in stool, constipation and diarrhea.  Endocrine: Negative for increased urination.  Genitourinary:  Positive for involuntary urination.  Musculoskeletal:  Positive for joint pain, gait problem, joint pain, joint swelling and morning stiffness. Negative for myalgias, muscle weakness, muscle tenderness and myalgias.  Skin:  Positive for hair loss. Negative for color change, rash and sensitivity to sunlight.  Allergic/Immunologic: Negative for susceptible to infections.  Neurological:  Negative for dizziness and headaches.  Hematological:  Negative for swollen glands.  Psychiatric/Behavioral:  Negative for depressed mood and sleep disturbance. The patient is nervous/anxious.     PMFS History:  Patient Active Problem List   Diagnosis Date Noted   Generalized osteoarthritis of multiple sites 01/03/2022   Pain in right shoulder 01/03/2022   Positive ANA (antinuclear antibody) 12/12/2021   Vitamin D deficiency 12/12/2021   Joint pain 09/05/2021   Multiple joint pain 09/05/2021   Hair loss 09/05/2021   Colitis 01/17/2021    Rectal bleeding 02/10/2020   Gastric polyps 02/10/2020   GERD (gastroesophageal reflux disease) 02/10/2020   Diarrhea 02/10/2020   Postmenopausal bleeding 07/20/2019   Symptomatic cholelithiasis 11/27/2018   Carpal tunnel syndrome on left 12/14/2014   Endometrial 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, complex 04/20/2014    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   GERD (gastroesophageal reflux disease)    History of kidney stones 07/2014   Plantar fascial fibromatosis of left foot    Sinus problem     Family History  Problem Relation Age of Onset   Diabetes Mother    Stroke Mother    Osteoarthritis Mother    Heart disease Father    Cancer Maternal Aunt        Breast   Colon cancer Neg Hx    Gastric cancer Neg Hx    Esophageal cancer Neg Hx    Past Surgical History:  Procedure Laterality Date   BIOPSY  09/04/2020   Procedure: BIOPSY;  Surgeon: Eloise Harman, DO;  [facility];  Service: Endoscopy;;  descending colon polyp   CARPAL TUNNEL RELEASE Right 10/04/2014   Procedure: RIGHT CARPAL TUNNEL RELEASE;  Surgeon: Daryll Brod, MD;  Location: [place];  Service: Orthopedics;  Laterality: Right;  REG/FAB   CARPAL TUNNEL RELEASE Left 11/15/2014   Procedure: LEFT CARPAL TUNNEL RELEASE;  Surgeon: Daryll Brod, MD;  Location: [place];  Service: Orthopedics;  Laterality: Left;   CERVICAL CONIZATION W/BX  04/2014   CHOLECYSTECTOMY  11/30/2014   COLONOSCOPY WITH PROPOFOL N/A 09/04/2020   Procedure: COLONOSCOPY WITH PROPOFOL;  Surgeon: Eloise Harman, DO;  [facility];  Service: Endoscopy;  Laterality: N/A;  7:30am   DILATION AND CURETTAGE OF UTERUS  04/2014   DILATION AND CURETTAGE OF UTERUS N/A 08/10/2019   Procedure: DILATATION AND CURETTAGE OF THE UTERUS UNDER ULTRASOUND GUIDANCE;  Surgeon: [name], MD;  Location: [place];   Service: Gynecology;  Laterality: N/A;   HYMENECTOMY  age 28 or age 43   hysteroscoptic insertion of mirena IUD  08/2015   HYSTEROSCOPY  02/2014   INTRAUTERINE DEVICE (IUD) INSERTION N/A 08/10/2019   Procedure: INTRAUTERINE DEVICE (IUD) REMOVAL AND REINSERTION;  Surgeon: [name], MD;  Location: [place];  Service: Gynecology;  Laterality: N/A;  PHARMACY MIRENA IUD   OPERATIVE ULTRASOUND N/A 08/10/2019   Procedure: OPERATIVE ULTRASOUND;  Surgeon: [name], MD;  Location[facility];  Service: Gynecology;  Laterality: N/A;   uterine polyps removed  06/03/2018   VARICOSE VEIN SURGERY Bilateral 15 yrs ago   tx with injections only no sx   WISDOM TOOTH EXTRACTION     as a teenager   Social History   Social History Narrative   Not on file    There is no immunization history on file for this patient.   Objective: Vital Signs: BP (!) 140/85 (BP  Location: Right Arm, Patient Position: Sitting, Cuff Size: Large)   Pulse 67   Resp 17   Ht 5' (1.524 m)   Wt 228 lb (103.4 kg)   BMI 44.53 kg/m    Physical Exam Constitutional:      Appearance: She is obese.  Eyes:     Conjunctiva/sclera: Conjunctivae normal.  Cardiovascular:     Rate and Rhythm: Normal rate and regular rhythm.  Pulmonary:     Effort: Pulmonary effort is normal.     Breath sounds: Normal breath sounds.  Lymphadenopathy:     Cervical: No cervical adenopathy.  Skin:    General: Skin is warm and dry.  Neurological:     Mental Status: She is alert.  Psychiatric:        Mood and Affect: Mood normal.      Musculoskeletal Exam:  Severely restricted active and passive range of motion of both shoulders, right shoulder in lower and internally rotated position with tenderness to pressure worst around bicipital groove Elbows full ROM no tenderness or swelling Wrists full ROM no tenderness or swelling Fingers full ROM no tenderness or swelling Knees with nearly full range of motion  but some pain at the end of extension and flexion, medial joint line tenderness to pressure with no palpable effusion, crepitus present bilaterally  Investigation: No additional findings.  Imaging: XR Shoulder Left  Result Date: 12/17/2021 X-ray left shoulder 4 views Severe loss of glenohumeral joint space with large inferior humeral osteophytes and sclerotic changes.  Acromiohumeral distance appears normal.  AC joint space is not ideally visualized but probable narrowing.  No erosions or abnormal calcifications seen. Impression Severe glenohumeral joint osteoarthritis  XR Shoulder Right  Result Date: 12/17/2021 X-ray right shoulder 4 views Severe glenohumeral joint osteoarthritis with joint space narrowing sclerosis and inferior bone spurring.  AC joint with is normal.  Acromiohumeral interval increased at greater than 25 mm.  No erosions or abnormal calcifications seen. Impression Moderate to severe glenohumeral joint osteoarthritis, inferior placement of humeral head consistent with displacement from large effusion or chronic dislocation  XR KNEE 3 VIEW LEFT  Result Date: 12/13/2021 X-ray left knee 3 views Severe osteoarthritis involving all compartments but more advanced in medial compartment.  There is varus deformity.  Prominent lateral osteophytes.  Patellofemoral joint space narrowing is mild with large patellar enthesophytes.  Large calcification or calcified free body in the suprapatellar space.  No erosions no visible joint effusion. Impression Severe medial compartment osteoarthritis less severe in the lateral patellofemoral compartments, associated joint varus deformity  XR KNEE 3 VIEW RIGHT  Result Date: 12/13/2021 X-ray right knee 3 views Severe tricompartmental osteoarthritis most severe in the medial compartment with complete loss of joint space.  Patella positioning is normal more severe loss of patellofemoral space on medial side.  There are prominent calcifications or  calcified free bodies present in the posterior joint space as well as suprapatellar space.  No erosive joint changes or large effusion seen. Impression Very severe osteoarthritis with multiple intra-articular and suprapatellar calcified bodies   Recent Labs: Lab Results  Component Value Date   WBC 5.8 09/05/2021   HGB 11.9 09/05/2021   PLT 323 09/05/2021   NA 140 09/05/2021   K 4.4 09/05/2021   CL 106 09/05/2021   CO2 23 09/05/2021   GLUCOSE 85 09/05/2021   BUN 14 09/05/2021   CREATININE 0.70 09/05/2021   BILITOT 0.5 09/05/2021   AST 12 09/05/2021   ALT 8 09/05/2021   PROT 7.0  09/05/2021   CALCIUM 9.4 09/05/2021   GFRAA >60 08/06/2019    Speciality Comments: No specialty comments available.  Procedures:  Large Joint Inj: R subacromial bursa on 01/03/2022 9:44 AM Indications: pain and joint swelling Details: 25 G 1.5 in needle, lateral approach Medications: 3 mL lidocaine 1 % Outcome: tolerated well, no immediate complications Procedure, treatment alternatives, risks and benefits explained, specific risks discussed. Consent was given by the patient. Immediately prior to procedure a time out was called to verify the correct patient, procedure, equipment, support staff and site/side marked as required. Patient was prepped and draped in the usual sterile fashion.    Large Joint Inj: L knee on 01/03/2022 9:45 AM Indications: pain Details: 25 G 1.5 in needle, anterior approach Medications: 3 mL lidocaine 1 %; 40 mg triamcinolone acetonide 40 MG/ML Outcome: tolerated well, no immediate complications Procedure, treatment alternatives, risks and benefits explained, specific risks discussed. Consent was given by the patient. Immediately prior to procedure a time out was called to verify the correct patient, procedure, equipment, support staff and site/side marked as required. Patient was prepped and draped in the usual sterile fashion.     Allergies: Aleve [naproxen]   Assessment /  Plan:     Visit Diagnoses: Generalized osteoarthritis of multiple sites - Plan: Large Joint Inj: R subacromial bursa, Large Joint Inj: L knee  Discussed treatment options for primary osteoarthritis of multiple sites.  No longer able to safely take long-term oral NSAIDs due to worsening of colitis.  She is using topical diclofenac on her knees with a partial benefit.  Discussed supplements including turmeric or glucosamine/chondroitin.  Discussed options for conservative management including medication for pain such as duloxetine, local joint injection with steroids, or physical therapy.  With pretty advanced disease on imaging may also need to consider orthopedic surgery depending how she responds to conservative treatment, weight would be a consideration for knees at present.  Trial of intra-articular steroid injections today for the right shoulder and left knee is the most symptomatic areas.  Chronic right shoulder pain  Right shoulder concerning for inferior displacement due to some kind of tear or large subacromial effusion or chronic dislocation.  Trying intra-articular steroid injection today and will follow-up soon to reassess if the reduction in swelling improves else would need shoulder MRI or referral to orthopedic surgery.  Orders: Orders Placed This Encounter  Procedures   Large Joint Inj: R subacromial bursa   Large Joint Inj: L knee   No orders of the defined types were placed in this encounter.    Follow-Up Instructions: No follow-ups on file.   Collier Salina, MD  Note - This record has been created using [employer].  Chart creation errors have been sought, but may not always  have been located. Such creation errors do not reflect on  the standard of medical care.

## 2022-01-03 NOTE — Patient Instructions (Signed)
For osteoarthritis several treatments may be beneficial:  - Topical antiinflammatory medicine such as diclofenac or Voltaren can be applied to  affected area as needed.  - Turmeric has some antiinflammatory effect similar to NSAIDs and may help, if taken as a supplement should not be taken above recommended doses.   - Local steroid injection is an option if symptoms become worse and not controlled by the above options. Some people with persistent symptoms can also try treatment with injections to supplement the joint cartilage.  - Some medicines for nerve related pain improvement can help with chronic arthritis such as cymbalta or lyrica  - Compressive sleeve can be helpful to support the joint especially if hurting or swelling with certain activities.  - Physical therapy referral can discuss exercises or activity modification to improve symptoms or strength if needed  - Joint replacement surgery is a definitive cure for osteoarthritis if not well controlled with above treatments.

## 2022-01-04 MED ORDER — LIDOCAINE HCL 1 % IJ SOLN
3.0000 mL | INTRAMUSCULAR | Status: AC | PRN
  Administered 2022-01-03: 3 mL

## 2022-01-04 MED ORDER — TRIAMCINOLONE ACETONIDE 40 MG/ML IJ SUSP
40.0000 mg | INTRAMUSCULAR | Status: AC | PRN
  Administered 2022-01-03: 40 mg via INTRA_ARTICULAR

## 2022-01-29 DIAGNOSIS — R69 Illness, unspecified: Secondary | ICD-10-CM | POA: Diagnosis not present

## 2022-01-29 DIAGNOSIS — F411 Generalized anxiety disorder: Secondary | ICD-10-CM | POA: Diagnosis not present

## 2022-01-29 DIAGNOSIS — R030 Elevated blood-pressure reading, without diagnosis of hypertension: Secondary | ICD-10-CM | POA: Diagnosis not present

## 2022-01-29 DIAGNOSIS — Z23 Encounter for immunization: Secondary | ICD-10-CM | POA: Diagnosis not present

## 2022-01-29 DIAGNOSIS — Z6841 Body Mass Index (BMI) 40.0 and over, adult: Secondary | ICD-10-CM | POA: Diagnosis not present

## 2022-02-11 NOTE — Progress Notes (Unsigned)
Office Visit Note  Patient: [name]             Date of Birth: 31-May-1955           MRN: [id number]             PCP: Manon Hilding, MD Referring: Manon Hilding, MD Visit Date: 02/12/2022   Subjective:  No chief complaint on file.   History of Present Illness: [name] is a 67 y.o. female here for follow up ***   Previous HPI 01/03/22 [name] is a 67 y.o. female here for follow up for osteoarthritis in multiple areas also right shoulder xray suggestive for some subacromial bursitis or chronic injury with displacement or dislocation. Symptoms remain about the same since our initial visit. Right shoulder is the worst and limited ROM is difficult to dress. Knee pain remains mot problematic along medial side of her joints.    Previous HPI 12/12/21 [name] is a 67 y.o. female here fgr evaluation of positive ANA and multiple joint pains. She has discontinued NSAIDs due to left sided colitis episodes without certain etiology established except suspected medication related. Has chronic joint pain worse now in multiple areas workup labs negative for RF, ESR, CRP, but low positive ANA 1:80 with negative ENA panel. She has also described hair loss in a generalized pattern and increased in longitudinal nail ridges these been worse during this recent time since the colitis symptoms.  She has had chronic joint pain in bilateral knees ongoing for years.  She estimates at least 5 or more years been self medicating with Aleve which is very beneficial for her joint pain and stiffness.  Initially taking daily and then had increased to twice daily consistently for much of this time.  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  She stopped this completely since her colitis problems and these have resolved but in addition to worsening of her knee pain and stiffness also developed trouble in her upper extremities and both  shoulders and both hands especially the right shoulder.  Also abruptly developed a palpable knot and swelling in the right shoulder with decreased range of motion in the past year.  She does use a cane for stability and notices leaning on counters or carts more to offload her knees with prolonged standing or walking. She has not noticed any new problem with skin rashes, oral nasal ulcers, Raynaud's symptoms, no abnormal bruising or bleeding outside of the colitis.  She does have dry eye symptoms these are worse when reading or on a screen for prolonged periods but does not require eyedrops for symptoms every day.  She was also found to have very low serum vitamin D level at lab testing earlier this year.   No Rheumatology ROS completed.   PMFS History:  Patient Active Problem List   Diagnosis Date Noted   Generalized osteoarthritis of multiple sites 01/03/2022   Pain in right shoulder 01/03/2022   Positive ANA (antinuclear antibody) 12/12/2021   Vitamin D deficiency 12/12/2021   Joint pain 09/05/2021   Multiple joint pain 09/05/2021   Hair loss 09/05/2021   Colitis 01/17/2021   Rectal bleeding 02/10/2020   Gastric polyps 02/10/2020   GERD (gastroesophageal reflux disease) 02/10/2020   Diarrhea 02/10/2020   Postmenopausal bleeding 07/20/2019   Symptomatic cholelithiasis 11/27/2018   Carpal tunnel syndrome on left 12/14/2014   Endometrial  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, complex 04/20/2014    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   GERD (gastroesophageal reflux disease)    History of kidney stones 07/2014   Plantar fascial fibromatosis of left foot    Sinus problem     Family History  Problem Relation Age of Onset   Diabetes Mother    Stroke Mother    Osteoarthritis Mother    Heart disease Father    Cancer Maternal Aunt        Breast   Colon cancer Neg Hx    Gastric cancer Neg Hx     Esophageal cancer Neg Hx    Past Surgical History:  Procedure Laterality Date   BIOPSY  09/04/2020   Procedure: BIOPSY;  Surgeon: Eloise Harman, DO;  [facility];  Service: Endoscopy;;  descending colon polyp   CARPAL TUNNEL RELEASE Right 10/04/2014   Procedure: RIGHT CARPAL TUNNEL RELEASE;  Surgeon: Daryll Brod, MD;  Location: [place];  Service: Orthopedics;  Laterality: Right;  REG/FAB   CARPAL TUNNEL RELEASE Left 11/15/2014   Procedure: LEFT CARPAL TUNNEL RELEASE;  Surgeon: Daryll Brod, MD;  Location: [place];  Service: Orthopedics;  Laterality: Left;   CERVICAL CONIZATION W/BX  04/2014   CHOLECYSTECTOMY  11/30/2014   COLONOSCOPY WITH PROPOFOL N/A 09/04/2020   Procedure: COLONOSCOPY WITH PROPOFOL;  Surgeon: Eloise Harman, DO;  [facility];  Service: Endoscopy;  Laterality: N/A;  7:30am   DILATION AND CURETTAGE OF UTERUS  04/2014   DILATION AND CURETTAGE OF UTERUS N/A 08/10/2019   Procedure: DILATATION AND CURETTAGE OF THE UTERUS UNDER ULTRASOUND GUIDANCE;  Surgeon: [name], MD;  Location: [place];  Service: Gynecology;  Laterality: N/A;   HYMENECTOMY  age 26 or age 72   hysteroscoptic insertion of mirena IUD  08/2015   HYSTEROSCOPY  02/2014   INTRAUTERINE DEVICE (IUD) INSERTION N/A 08/10/2019   Procedure: INTRAUTERINE DEVICE (IUD) REMOVAL AND REINSERTION;  Surgeon: [name], MD;  Location: [place];  Service: Gynecology;  Laterality: N/A;  PHARMACY MIRENA IUD   OPERATIVE ULTRASOUND N/A 08/10/2019   Procedure: OPERATIVE ULTRASOUND;  Surgeon: [name], MD;  Location: [facility];  Service: Gynecology;  Laterality: N/A;   uterine polyps removed  06/03/2018   VARICOSE VEIN SURGERY Bilateral 15 yrs ago   tx with injections only no sx   WISDOM TOOTH EXTRACTION     as a teenager   Social History   Social History Narrative   Not on file    There is no immunization  history on file for this patient.   Objective: Vital Signs: There were no vitals taken for this visit.   Physical Exam   Musculoskeletal Exam: ***  CDAI Exam: CDAI Score: -- Patient Global: --; Provider Global: -- Swollen: --; Tender: -- Joint Exam 02/12/2022   No joint exam has been documented for this visit   There is currently no information documented on the homunculus. Go to the Rheumatology activity and complete the homunculus joint exam.  Investigation: No additional findings.  Imaging: No results found.  Recent Labs: Lab Results  Component Value Date   WBC 5.8 09/05/2021   HGB 11.9 09/05/2021   PLT 323 09/05/2021   NA 140 09/05/2021   K 4.4 09/05/2021   CL 106 09/05/2021   CO2  23 09/05/2021   GLUCOSE 85 09/05/2021   BUN 14 09/05/2021   CREATININE 0.70 09/05/2021   BILITOT 0.5 09/05/2021   AST 12 09/05/2021   ALT 8 09/05/2021   PROT 7.0 09/05/2021   CALCIUM 9.4 09/05/2021   GFRAA >60 08/06/2019    Speciality Comments: No specialty comments available.  Procedures:  No procedures performed Allergies: Aleve [naproxen]   Assessment / Plan:     Visit Diagnoses: No diagnosis found.  ***  Orders: No orders of the defined types were placed in this encounter.  No orders of the defined types were placed in this encounter.    Follow-Up Instructions: No follow-ups on file.   Collier Salina, MD  Note - This record has been created using [employer].  Chart creation errors have been sought, but may not always  have been located. Such creation errors do not reflect on  the standard of medical care.

## 2022-02-12 ENCOUNTER — Ambulatory Visit: Payer: Medicare HMO | Attending: Internal Medicine | Admitting: Internal Medicine

## 2022-02-12 ENCOUNTER — Encounter: Payer: Self-pay | Admitting: Internal Medicine

## 2022-02-12 VITALS — BP 120/86 | HR 65 | Resp 15 | Ht 60.0 in | Wt 226.8 lb

## 2022-02-12 DIAGNOSIS — M25561 Pain in right knee: Secondary | ICD-10-CM

## 2022-02-12 DIAGNOSIS — M25512 Pain in left shoulder: Secondary | ICD-10-CM | POA: Diagnosis not present

## 2022-02-12 DIAGNOSIS — M159 Polyosteoarthritis, unspecified: Secondary | ICD-10-CM | POA: Diagnosis not present

## 2022-02-12 MED ORDER — LIDOCAINE HCL 1 % IJ SOLN
3.0000 mL | INTRAMUSCULAR | Status: AC | PRN
  Administered 2022-02-12: 3 mL

## 2022-02-12 MED ORDER — TRIAMCINOLONE ACETONIDE 40 MG/ML IJ SUSP
40.0000 mg | INTRAMUSCULAR | Status: AC | PRN
  Administered 2022-02-12: 40 mg via INTRA_ARTICULAR

## 2022-03-05 ENCOUNTER — Encounter: Payer: Self-pay | Admitting: Internal Medicine

## 2022-04-09 ENCOUNTER — Encounter: Payer: Self-pay | Admitting: Internal Medicine

## 2022-04-09 ENCOUNTER — Ambulatory Visit: Payer: Medicare HMO | Attending: Internal Medicine | Admitting: Internal Medicine

## 2022-04-09 VITALS — BP 131/82 | HR 69 | Resp 16 | Ht 60.0 in | Wt 228.0 lb

## 2022-04-09 DIAGNOSIS — M159 Polyosteoarthritis, unspecified: Secondary | ICD-10-CM | POA: Diagnosis not present

## 2022-04-09 DIAGNOSIS — I809 Phlebitis and thrombophlebitis of unspecified site: Secondary | ICD-10-CM

## 2022-04-09 NOTE — Progress Notes (Signed)
Office Visit Note  Patient: [name]             Date of Birth: 11-09-1955           MRN: [id number]             PCP: Manon Hilding, MD Referring: Manon Hilding, MD Visit Date: 04/09/2022   Subjective:  Follow-up (Patient states she has varicose veins. Patient states she has a knot on her left knee. )   History of Present Illness: [name] is a 67 y.o. female here for follow up for osteoarthritis joint pain involving shoulders and knees.  After repeat steroid injection 2 months ago she had symptom improvement.  Shoulder pain is doing well on both sides though she still limited with her overhead range of motion.  Her knee injection from about 4 months ago starting to notice a little bit more pain and stiffness though still better than prior to treatment.  She has noticed some increased swelling in her left leg and there is a firm nodule or lump under the skin she notices just below the level of the knee.  Small increase in redness in that leg swelling.  With her starting to notice increased left knee symptoms again she has questions about viscosupplementation treatment.   Previous HPI 02/12/22 [name] is a 67 y.o. female here for follow up for chronic osteoarthritis pain in multiple areas we did steroid injections in the right shoulder and left knee last month. She reports symptoms improved from 10/10 pain to about 5-6/10 pain after these shots. Also less aching or radiating pain. She has slight improvement in mobility and function in the right shoulder. Since the original shot notices some decline in the benefit so far, back to about 8/10 knee pain. She is interested in trying injection for her other shoulder and knee which remain problematic.   Previous HPI 01/03/22 [name] is a 67 y.o. female here for follow up for osteoarthritis in multiple areas also right shoulder xray suggestive for some subacromial bursitis or chronic injury with displacement or dislocation.  Symptoms remain about the same since our initial visit. Right shoulder is the worst and limited ROM is difficult to dress. Knee pain remains mot problematic along medial side of her joints.    Previous HPI 12/12/21 [name] is a 67 y.o. female here fgr evaluation of positive ANA and multiple joint pains. She has discontinued NSAIDs due to left sided colitis episodes without certain etiology established except suspected medication related. Has chronic joint pain worse now in multiple areas workup labs negative for RF, ESR, CRP, but low positive ANA 1:80 with negative ENA panel. She has also described hair loss in a generalized pattern and increased in longitudinal nail ridges these been worse during this recent time since the colitis symptoms.  She has had chronic joint pain in bilateral knees ongoing for years.  She estimates at least 5 or more years been self medicating with Aleve which is very beneficial for her joint pain and stiffness.  Initially taking daily and then had increased to twice daily consistently for much of this time.  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  She stopped this completely since her colitis problems and these have resolved but in addition to worsening of her knee pain and stiffness also developed trouble in her upper extremities and both  shoulders and both hands especially the right shoulder.  Also abruptly developed a palpable knot and swelling in the right shoulder with decreased range of motion in the past year.  She does use a cane for stability and notices leaning on counters or carts more to offload her knees with prolonged standing or walking. She has not noticed any new problem with skin rashes, oral nasal ulcers, Raynaud's symptoms, no abnormal bruising or bleeding outside of the colitis.  She does have dry eye symptoms these are worse when reading or on a screen for prolonged periods but does  not require eyedrops for symptoms every day.  She was also found to have very low serum vitamin D level at lab testing earlier this year.   Review of Systems  Constitutional:  Positive for fatigue.  HENT:  Negative for mouth sores and mouth dryness.   Eyes:  Negative for dryness.  Respiratory:  Negative for shortness of breath.   Cardiovascular:  Negative for chest pain and palpitations.  Gastrointestinal:  Negative for blood in stool, constipation and diarrhea.  Endocrine: Negative for increased urination.  Genitourinary:  Positive for involuntary urination.  Musculoskeletal:  Positive for joint pain, gait problem, joint pain, joint swelling, myalgias, morning stiffness, muscle tenderness and myalgias. Negative for muscle weakness.  Skin:  Negative for color change, rash, hair loss and sensitivity to sunlight.  Allergic/Immunologic: Negative for susceptible to infections.  Neurological:  Positive for dizziness. Negative for headaches.  Hematological:  Negative for swollen glands.  Psychiatric/Behavioral:  Positive for depressed mood. Negative for sleep disturbance. The patient is nervous/anxious.     PMFS History:  Patient Active Problem List   Diagnosis Date Noted   Thrombophlebitis 04/09/2022   Generalized osteoarthritis of multiple sites 01/03/2022   Pain in right shoulder 01/03/2022   Positive ANA (antinuclear antibody) 12/12/2021   Vitamin D deficiency 12/12/2021   Joint pain 09/05/2021   Multiple joint pain 09/05/2021   Hair loss 09/05/2021   Colitis 01/17/2021   Rectal bleeding 02/10/2020   Gastric polyps 02/10/2020   GERD (gastroesophageal reflux disease) 02/10/2020   Diarrhea 02/10/2020   Postmenopausal bleeding 07/20/2019   Symptomatic cholelithiasis 11/27/2018   Carpal tunnel syndrome on left 12/14/2014   Endometrial 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, complex 04/20/2014    Past Medical History:  Diagnosis Date   Anxiety    Arthritis    knees, thumbs   Carpal tunnel  syndrome of right wrist 09/2014   GERD (gastroesophageal reflux disease)    History of kidney stones 07/2014   Plantar fascial fibromatosis of left foot    Sinus problem     Family History  Problem Relation Age of Onset   Diabetes Mother    Stroke Mother    Osteoarthritis Mother    Heart disease Father    Cancer Maternal Aunt        Breast   Colon cancer Neg Hx    Gastric cancer Neg Hx    Esophageal cancer Neg Hx    Past Surgical History:  Procedure Laterality Date   BIOPSY  09/04/2020   Procedure: BIOPSY;  Surgeon: Eloise Harman, DO;  [facility];  Service: Endoscopy;;  descending colon polyp   CARPAL TUNNEL RELEASE Right 10/04/2014   Procedure: RIGHT CARPAL TUNNEL RELEASE;  Surgeon: Daryll Brod, MD;  Location: [facility];  Service: Orthopedics;  Laterality: Right;  REG/FAB   CARPAL TUNNEL RELEASE Left 11/15/2014   Procedure: LEFT CARPAL TUNNEL RELEASE;  Surgeon: Daryll Brod,  MD;  Location: [facility];  Service: Orthopedics;  Laterality: Left;   CERVICAL CONIZATION W/BX  04/2014   CHOLECYSTECTOMY  11/30/2014   COLONOSCOPY WITH PROPOFOL N/A 09/04/2020   Procedure: COLONOSCOPY WITH PROPOFOL;  Surgeon: Eloise Harman, DO;  Location: [facility];  Service: Endoscopy;  Laterality: N/A;  7:30am   DILATION AND CURETTAGE OF UTERUS  04/2014   DILATION AND CURETTAGE OF UTERUS N/A 08/10/2019   Procedure: DILATATION AND CURETTAGE OF THE UTERUS UNDER ULTRASOUND GUIDANCE;  Surgeon: [name], MD;  Location: [facility];  Service: Gynecology;  Laterality: N/A;   HYMENECTOMY  age 69 or age 68   hysteroscoptic insertion of mirena IUD  08/2015   HYSTEROSCOPY  02/2014   INTRAUTERINE DEVICE (IUD) INSERTION N/A 08/10/2019   Procedure: INTRAUTERINE DEVICE (IUD) REMOVAL AND REINSERTION;  Surgeon: [name], MD;  Location: [facility];  Service: Gynecology;  Laterality: N/A;  PHARMACY MIRENA IUD   OPERATIVE ULTRASOUND N/A  08/10/2019   Procedure: OPERATIVE ULTRASOUND;  Surgeon: [name], MD;  Location: [facility];  Service: Gynecology;  Laterality: N/A;   uterine polyps removed  06/03/2018   VARICOSE VEIN SURGERY Bilateral 15 yrs ago   tx with injections only no sx   WISDOM TOOTH EXTRACTION     as a teenager   Social History   Social History Narrative   Not on file    There is no immunization history on file for this patient.   Objective: Vital Signs: BP 131/82 (BP Location: Right Arm, Patient Position: Sitting, Cuff Size: Normal)   Pulse 69   Resp 16   Ht 5' (1.524 m)   Wt 228 lb (103.4 kg)   BMI 44.53 kg/m    Physical Exam Constitutional:      Appearance: She is obese.  Cardiovascular:     Rate and Rhythm: Normal rate and regular rhythm.  Pulmonary:     Effort: Pulmonary effort is normal.     Breath sounds: Normal breath sounds.  Musculoskeletal:     Left lower leg: Edema present.  Lymphadenopathy:     Cervical: No cervical adenopathy.  Skin:    General: Skin is warm and dry.  Neurological:     Mental Status: She is alert.      Musculoskeletal Exam:  Significantly restricted shoulder range of motion especially with overhead abduction and external rotation, no tenderness to pressure or palpable swelling Elbows full ROM no tenderness or swelling Wrists full ROM no tenderness or swelling Fingers full ROM no tenderness or swelling Knees good range of motion some pain with full extension and flexion on the left side, there is joint line tenderness to pressure with no palpable swelling, bilateral patellofemoral crepitus present  There is a firm knot or subcutaneous nodule on the lateral aspect of the leg distal to the knee, no overlying warmth or erythema, there is trace pitting edema distal to this throughout the lower leg  Limited ultrasound exam shows some edema present in subcutaneous fat tissue no organized fluid collection no visible blood vessel  thrombosis  Investigation: No additional findings.  Imaging: No results found.  Recent Labs: Lab Results  Component Value Date   WBC 5.8 09/05/2021   HGB 11.9 09/05/2021   PLT 323 09/05/2021   NA 140 09/05/2021   K 4.4 09/05/2021   CL 106 09/05/2021   CO2 23 09/05/2021   GLUCOSE 85 09/05/2021   BUN 14 09/05/2021   CREATININE 0.70 09/05/2021  BILITOT 0.5 09/05/2021   AST 12 09/05/2021   ALT 8 09/05/2021   PROT 7.0 09/05/2021   CALCIUM 9.4 09/05/2021   GFRAA >60 08/06/2019    Speciality Comments: No specialty comments available.  Procedures:  No procedures performed Allergies: Aleve [naproxen]   Assessment / Plan:     Visit Diagnoses: Generalized osteoarthritis of multiple sites  Overall symptoms are doing partially better since steroid injection in both shoulders and both knees in the past few months.  Starting to see increase symptoms again on left knee which has been about 4 months.  I recommend discontinuing monitoring for now follow-up in 2 months can repeat steroid injection in 1 or both knees at that time if symptoms keep increasing.  Discussed viscosupplementation would be an option for her knees but would recommend trial of at least 2 rounds of steroid injection prior to this.  Thrombophlebitis  The subcutaneous swelling in the left leg suspicious for possible thrombophlebitis.  Has known varicose veins and pedal edema though symptoms recently increased.  Recommend initial supportive care with local topical NSAIDs and warm compress treatment.  Discussed warning signs of increasing leg edema redness or pain.  Orders: No orders of the defined types were placed in this encounter.  No orders of the defined types were placed in this encounter.    Follow-Up Instructions: Return in about 2 months (around 06/09/2022) for OA knees/shoulders ?injections f/u 28mos.   Collier Salina, MD  Note - This record has been created using [employer].  Chart creation  errors have been sought, but may not always  have been located. Such creation errors do not reflect on  the standard of medical care.

## 2022-04-29 ENCOUNTER — Encounter: Payer: Self-pay | Admitting: Gastroenterology

## 2022-04-29 ENCOUNTER — Ambulatory Visit (INDEPENDENT_AMBULATORY_CARE_PROVIDER_SITE_OTHER): Payer: Medicare HMO | Admitting: Gastroenterology

## 2022-04-29 VITALS — BP 109/72 | HR 73 | Temp 98.8°F | Ht 60.0 in | Wt 233.4 lb

## 2022-04-29 DIAGNOSIS — K219 Gastro-esophageal reflux disease without esophagitis: Secondary | ICD-10-CM

## 2022-04-29 NOTE — Progress Notes (Signed)
GI Office Note    Referring Provider: Estanislado Pandy, MD Primary Care Physician:  Estanislado Pandy, MD  Primary Gastroenterologist: Hennie Duos. Marletta Lor, DO   Chief Complaint   Chief Complaint  Patient presents with   Follow-up    Doing well, no issues.    History of Present Illness   [name] is a 67 y.o. female presenting today for follow up. Last seen in 08/2021. She has history of GERD, colon polyps, and history of left-sided colitis (possibly NSAID related but other etiologies include ischemia and inflammatory). Initially seen on 12/2019 colonoscopy when she presented with rectal bleeding. Follow up colonoscopy in 08/2020 with improvement off NSAIDs. Plans to consider CTA A/P if symptoms return. Next colonoscopy due in 08/2025 for adenomatous colon polyps.   After last visit, extensive labs (08/2021) with normal sed rate, CRP, CMET, CBC, TSH, AlphaGal, Rheumatoid Factor. Her ANA was positive. Vit D was low treated with weekly high dose vit D.   Saw rheumatology, no RA or lupus. Vit D level repeated and normal, continued OTC vit D 2000 IU daily.  Today: Doing well from a GI standpoint.  As long as she watches her diet, she can control her reflux and stools.  She has learned to limit salads to 2 to 3 days/week otherwise she gets loose stools.  She avoids greasy foods, corn, nuts, overeating.  Stress seems to affect her bowel function as well.  Has mild abdominal discomfort prior to bowel movement but otherwise no abdominal pain.  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  Mother is 62, with dementia, gets up several times during the night.  Because of her disrupted sleep she has a lot of fatigue.  Continues to have a lot of arthritic pain, has been undergoing steroid injections by rheumatologist with goals of working up to gel injections  Medications   Current Outpatient Medications  Medication Sig Dispense Refill   acetaminophen (TYLENOL) 500 MG tablet Take 1,000 mg by mouth  See admin instructions. Take 1000 mg in the morning, may take an additional 1000 mg every 6 hours as needed for pain     Cholecalciferol (VITAMIN D) 125 MCG (5000 UT) CAPS Take by mouth.     diclofenac Sodium (VOLTAREN) 1 % GEL Apply 1 application topically 2 (two) times daily.     FLUoxetine (PROZAC) 20 MG tablet Take 20 mg by mouth daily.     omeprazole (PRILOSEC) 20 MG capsule Take 20 mg by mouth daily.     TURMERIC PO Take by mouth daily.     No current facility-administered medications for this visit.    Allergies   Allergies as of 04/29/2022 - Review Complete 04/29/2022  Allergen Reaction Noted   Aleve [naproxen] Other (See Comments) 12/12/2021       Review of Systems   General: Negative for anorexia, weight loss, fever, chills, fatigue, weakness. ENT: Negative for hoarseness, difficulty swallowing , nasal congestion. CV: Negative for chest pain, angina, palpitations, dyspnea on exertion, peripheral edema.  Respiratory: Negative for dyspnea at rest, dyspnea on exertion, cough, sputum, wheezing.  GI: See history of present illness. GU:  Negative for dysuria, hematuria, urinary incontinence, urinary frequency, nocturnal urination.  Endo: Negative for unusual weight change.     Physical Exam   BP 109/72 (BP Location: Right Arm, Patient Position: Sitting, Cuff Size: Large)   Pulse 73   Temp 98.8 F (37.1 C) (Temporal)   Ht 5' (1.524 m)  Wt 233 lb 6.4 oz (105.9 kg)   SpO2 98%   BMI 45.58 kg/m    General: Well-nourished, well-developed in no acute distress.  Eyes: No icterus. Mouth: Oropharyngeal mucosa moist and pink  . Neuro: Alert and oriented x 4   Skin: Warm and dry, no jaundice.   Psych: Alert and cooperative, normal mood and affect.  Labs   No recent labs  Imaging Studies   No results found.  Assessment   GERD: Well-controlled on omeprazole, antireflux measures.  Left-sided colitis: History of left-sided colitis as outlined above.  Likely NSAID  related.  Clinically doing well.   PLAN   Continue omeprazole daily before breakfast. Continue avoiding trigger foods to adequately manage heartburn and loose stools. Surveillance colonoscopy in August 2027. Return office visit in 1 year or sooner if needed.   Leanna Battles. Melvyn Neth, MHS, PA-C [facility]

## 2022-04-29 NOTE — Patient Instructions (Signed)
It was so good to see you today! I'm glad you are doing well! Continue your omeprazole daily for acid reflux. Continue to avoid food triggers to best control your bowels.  Your next colonoscopy will be due in 08/2025. Return for a follow up in about a year or call sooner if needed.    It was a pleasure to see you today. I want to create trusting relationships with patients and provide genuine, compassionate, and quality care. I truly value your feedback, so please be on the lookout for a survey regarding your visit with me today. I appreciate your time in completing this!

## 2022-06-11 ENCOUNTER — Ambulatory Visit: Payer: Medicare HMO | Attending: Internal Medicine | Admitting: Internal Medicine

## 2022-06-11 ENCOUNTER — Encounter: Payer: Self-pay | Admitting: Internal Medicine

## 2022-06-11 VITALS — BP 131/76 | HR 66 | Resp 14 | Ht 60.0 in | Wt 234.0 lb

## 2022-06-11 DIAGNOSIS — I809 Phlebitis and thrombophlebitis of unspecified site: Secondary | ICD-10-CM

## 2022-06-11 DIAGNOSIS — M159 Polyosteoarthritis, unspecified: Secondary | ICD-10-CM

## 2022-06-11 NOTE — Progress Notes (Signed)
Office Visit Note  Patient: [name]             Date of Birth: 15-Oct-1955           MRN: [id number]             PCP: Estanislado Pandy, MD Referring: Estanislado Pandy, MD Visit Date: 06/11/2022   Subjective:  Follow-up (Patient states she still has a knot on her left leg but it is smaller and less sore. )   History of Present Illness: [name] is a 67 y.o. female here for follow up follow-up of generalized osteoarthritis and some left leg swelling suspected as thrombophlebitis.  Since her last visit continues joint pain and stiffness in multiple areas currently most problematic affecting both knees.  Usually worse with prolonged standing and walking.  Tender area of swelling on the left lateral knee has decreased in size and tenderness though not entirely resolved.  No new skin rash or increase in edema.  Previous HPI 04/09/22 [name] is a 67 y.o. female here for follow up for osteoarthritis joint pain involving shoulders and knees.  After repeat steroid injection 2 months ago she had symptom improvement.  Shoulder pain is doing well on both sides though she still limited with her overhead range of motion.  Her knee injection from about 4 months ago starting to notice a little bit more pain and stiffness though still better than prior to treatment.  She has noticed some increased swelling in her left leg and there is a firm nodule or lump under the skin she notices just below the level of the knee.  Small increase in redness in that leg swelling.  With her starting to notice increased left knee symptoms again she has questions about viscosupplementation treatment.   Previous HPI 02/12/22 [name] is a 67 y.o. female here for follow up for chronic osteoarthritis pain in multiple areas we did steroid injections in the right shoulder and left knee last month. She reports symptoms improved from 10/10 pain to about 5-6/10 pain after these shots. Also less aching or radiating pain. She  has slight improvement in mobility and function in the right shoulder. Since the original shot notices some decline in the benefit so far, back to about 8/10 knee pain. She is interested in trying injection for her other shoulder and knee which remain problematic.   Previous HPI 01/03/22 [name] is a 67 y.o. female here for follow up for osteoarthritis in multiple areas also right shoulder xray suggestive for some subacromial bursitis or chronic injury with displacement or dislocation. Symptoms remain about the same since our initial visit. Right shoulder is the worst and limited ROM is difficult to dress. Knee pain remains most problematic along medial side of her joints.    Previous HPI 12/12/21 [name] is a 67 y.o. female here fgr evaluation of positive ANA and multiple joint pains. She has discontinued NSAIDs due to left sided colitis episodes without certain etiology established except suspected medication related. Has chronic joint pain worse now in multiple areas workup labs negative for RF, ESR, CRP, but low positive ANA 1:80 with negative ENA panel. She has also described hair loss in a generalized pattern and increased in longitudinal nail ridges these been worse during this recent time since the colitis symptoms.  She has had chronic joint pain in bilateral knees ongoing for years.  She estimates at least 5 or more years been  self medicating with Aleve which is very beneficial for her joint pain and stiffness.  Initially taking daily and then had increased to twice daily consistently for much of this time.  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  She stopped this completely since her colitis problems and these have resolved but in addition to worsening of her knee pain and stiffness also developed trouble in her upper extremities and both shoulders and both hands especially the right shoulder.  Also abruptly  developed a palpable knot and swelling in the right shoulder with decreased range of motion in the past year.  She does use a cane for stability and notices leaning on counters or carts more to offload her knees with prolonged standing or walking. She has not noticed any new problem with skin rashes, oral nasal ulcers, Raynaud's symptoms, no abnormal bruising or bleeding outside of the colitis.  She does have dry eye symptoms these are worse when reading or on a screen for prolonged periods but does not require eyedrops for symptoms every day.  She was also found to have very low serum vitamin D level at lab testing earlier this year.   Review of Systems  Constitutional:  Positive for fatigue.  HENT:  Negative for mouth sores and mouth dryness.   Eyes:  Positive for dryness.  Respiratory:  Negative for shortness of breath.   Cardiovascular:  Negative for chest pain and palpitations.  Gastrointestinal:  Negative for blood in stool, constipation and diarrhea.  Endocrine: Negative for increased urination.  Genitourinary:  Negative for involuntary urination.  Musculoskeletal:  Positive for joint pain, gait problem, joint pain and morning stiffness. Negative for joint swelling, myalgias, muscle weakness, muscle tenderness and myalgias.  Skin:  Negative for color change, rash, hair loss and sensitivity to sunlight.  Allergic/Immunologic: Negative for susceptible to infections.  Neurological:  Negative for dizziness and headaches.  Hematological:  Negative for swollen glands.  Psychiatric/Behavioral:  Positive for depressed mood. Negative for sleep disturbance. The patient is nervous/anxious.     PMFS History:  Patient Active Problem List   Diagnosis Date Noted   Thrombophlebitis 04/09/2022   Generalized osteoarthritis of multiple sites 01/03/2022   Pain in right shoulder 01/03/2022   Positive ANA (antinuclear antibody) 12/12/2021   Vitamin D deficiency 12/12/2021   Joint pain 09/05/2021    Multiple joint pain 09/05/2021   Hair loss 09/05/2021   Colitis 01/17/2021   Rectal bleeding 02/10/2020   Gastric polyps 02/10/2020   GERD (gastroesophageal reflux disease) 02/10/2020   Diarrhea 02/10/2020   Postmenopausal bleeding 07/20/2019   Symptomatic cholelithiasis 11/27/2018   Carpal tunnel syndrome on left 12/14/2014   Endometrial 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, complex 04/20/2014    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   GERD (gastroesophageal reflux disease)    History of kidney stones 07/2014   Plantar fascial fibromatosis of left foot    Sinus problem     Family History  Problem Relation Age of Onset   Diabetes Mother    Stroke Mother    Osteoarthritis Mother    Heart disease Father    Cancer Maternal Aunt        Breast   Colon cancer Neg Hx    Gastric cancer Neg Hx    Esophageal cancer Neg Hx  Past Surgical History:  Procedure Laterality Date   BIOPSY  09/04/2020   Procedure: BIOPSY;  Surgeon: Lanelle Bal, DO;  Location: [facility];  Service: Endoscopy;;  descending colon polyp   CARPAL TUNNEL RELEASE Right 10/04/2014   Procedure: RIGHT CARPAL TUNNEL RELEASE;  Surgeon: Cindee Salt, MD;  Location: [facility];  Service: Orthopedics;  Laterality: Right;  REG/FAB   CARPAL TUNNEL RELEASE Left 11/15/2014   Procedure: LEFT CARPAL TUNNEL RELEASE;  Surgeon: Cindee Salt, MD;  Location: [facility];  Service: Orthopedics;  Laterality: Left;   CERVICAL CONIZATION W/BX  04/2014   CHOLECYSTECTOMY  11/30/2014   COLONOSCOPY WITH PROPOFOL N/A 09/04/2020   Procedure: COLONOSCOPY WITH PROPOFOL;  Surgeon: Lanelle Bal, DO;  Location: [facility];  Service: Endoscopy;  Laterality: N/A;  7:30am   DILATION AND CURETTAGE OF UTERUS  04/2014   DILATION AND CURETTAGE OF UTERUS N/A 08/10/2019   Procedure: DILATATION AND CURETTAGE OF THE UTERUS UNDER ULTRASOUND  GUIDANCE;  Surgeon: Adolphus Birchwood, MD;  Location: [facility] [place];  Service: Gynecology;  Laterality: N/A;   HYMENECTOMY  age 24 or age 63   hysteroscoptic insertion of mirena IUD  08/2015   HYSTEROSCOPY  02/2014   INTRAUTERINE DEVICE (IUD) INSERTION N/A 08/10/2019   Procedure: INTRAUTERINE DEVICE (IUD) REMOVAL AND REINSERTION;  Surgeon: Adolphus Birchwood, MD;  Location: [facility];  Service: Gynecology;  Laterality: N/A;  PHARMACY MIRENA IUD   OPERATIVE ULTRASOUND N/A 08/10/2019   Procedure: OPERATIVE ULTRASOUND;  Surgeon: Adolphus Birchwood, MD;  Location: [facility];  Service: Gynecology;  Laterality: N/A;   uterine polyps removed  06/03/2018   VARICOSE VEIN SURGERY Bilateral 15 yrs ago   tx with injections only no sx   WISDOM TOOTH EXTRACTION     as a teenager   Social History   Social History Narrative   Not on file    There is no immunization history on file for this patient.   Objective: Vital Signs: BP 131/76 (BP Location: Left Arm, Patient Position: Sitting, Cuff Size: Normal)   Pulse 66   Resp 14   Ht 5' (1.524 m)   Wt 234 lb (106.1 kg)   BMI 45.70 kg/m    Physical Exam Constitutional:      Appearance: She is obese.  Cardiovascular:     Rate and Rhythm: Normal rate and regular rhythm.  Pulmonary:     Effort: Pulmonary effort is normal.     Breath sounds: Normal breath sounds.  Skin:    General: Skin is warm and dry.  Neurological:     Mental Status: She is alert.  Psychiatric:        Mood and Affect: Mood normal.      Musculoskeletal Exam:  Restricted active and passive range of motion of both shoulders, tenderness to pressure Elbows full ROM no tenderness or swelling Wrists full ROM no tenderness or swelling Fingers full ROM no tenderness or swelling Bilateral knee tenderness worst along medial joint line, left knee with area of tenderness on lateral side just distal to joint, without palpable swelling, erythema, or warmth     Investigation: No additional findings.  Imaging: No results found.  Recent Labs: Lab Results  Component Value Date   WBC 5.8 09/05/2021   HGB 11.9 09/05/2021   PLT 323 09/05/2021   NA 140 09/05/2021   K 4.4 09/05/2021   CL 106 09/05/2021   CO2 23 09/05/2021   GLUCOSE 85  09/05/2021   BUN 14 09/05/2021   CREATININE 0.70 09/05/2021   BILITOT 0.5 09/05/2021   AST 12 09/05/2021   ALT 8 09/05/2021   PROT 7.0 09/05/2021   CALCIUM 9.4 09/05/2021   GFRAA >60 08/06/2019    Speciality Comments: No specialty comments available.  Procedures:  Large Joint Inj: bilateral knee on 06/11/2022 11:50 AM Indications: pain Details: 27 G 1.5 in needle, anterior approach Medications (Right): 3 mL lidocaine 1 %; 40 mg triamcinolone acetonide 40 MG/ML Medications (Left): 3 mL lidocaine 1 %; 40 mg triamcinolone acetonide 40 MG/ML Outcome: tolerated well, no immediate complications Procedure, treatment alternatives, risks and benefits explained, specific risks discussed. Consent was given by the patient. Immediately prior to procedure a time out was called to verify the correct patient, procedure, equipment, support staff and site/side marked as required. Patient was prepped and draped in the usual sterile fashion.     Allergies: Aleve [naproxen]   Assessment / Plan:     Visit Diagnoses: Generalized osteoarthritis of multiple sites - Plan: Large Joint Inj: bilateral knee  Osteoarthritis pain in shoulders and knees but knees are currently the most problematic site.  She is interested in avoiding surgery would prefer medical management including injections.  She is taking turmeric and using topical Voltaren and Tylenol as needed which are partially helpful.  Will repeat intra-articular steroid injection today.  If she has a quickly weaning benefit again could benefit with proceeding to viscosupplementation.  Thrombophlebitis  Tender area on lateral aspect of left leg improving with conservative  treatment recommend continuing use of warm compress when symptomatic and using topical Voltaren in affected area.  Orders: Orders Placed This Encounter  Procedures   Large Joint Inj: bilateral knee   No orders of the defined types were placed in this encounter.    Follow-Up Instructions: Return in about 3 months (around 09/11/2022) for OA b/l inj f/u 3mos.   Fuller Plan, MD  Note - This record has been created using [employer].  Chart creation errors have been sought, but may not always  have been located. Such creation errors do not reflect on  the standard of medical care.

## 2022-06-17 MED ORDER — TRIAMCINOLONE ACETONIDE 40 MG/ML IJ SUSP
40.0000 mg | INTRAMUSCULAR | Status: AC | PRN
  Administered 2022-06-11: 40 mg via INTRA_ARTICULAR

## 2022-06-17 MED ORDER — LIDOCAINE HCL 1 % IJ SOLN
3.0000 mL | INTRAMUSCULAR | Status: AC | PRN
  Administered 2022-06-11: 3 mL

## 2022-06-30 IMAGING — US US PELVIS COMPLETE WITH TRANSVAGINAL
1 series · 13 of 25 positions shown · non-contrast
Comparison: 11/19/2018

CLINICAL DATA: Postmenopausal bleeding, history of endometrial
hyperplasia, endometrial biopsy in 6514, D&C in 9675, IUD
placement



[Series 1: us pelvic complete with transvaginal · 13 of 69 slices shown]
[im 1/69]
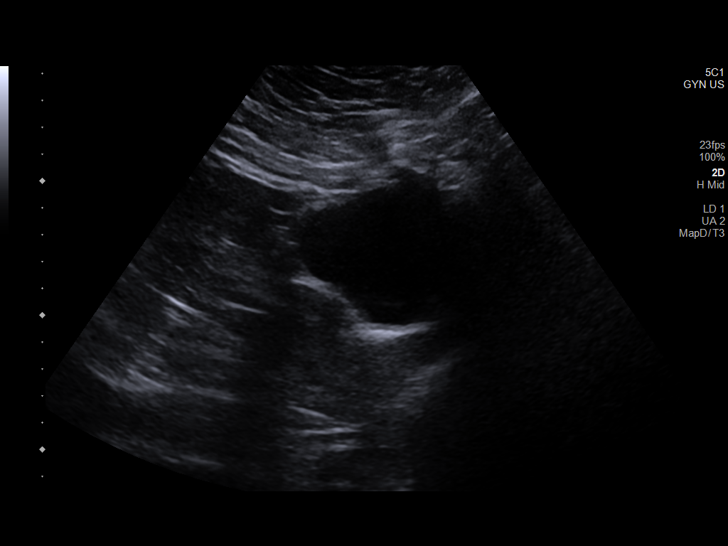
[im 6/69]
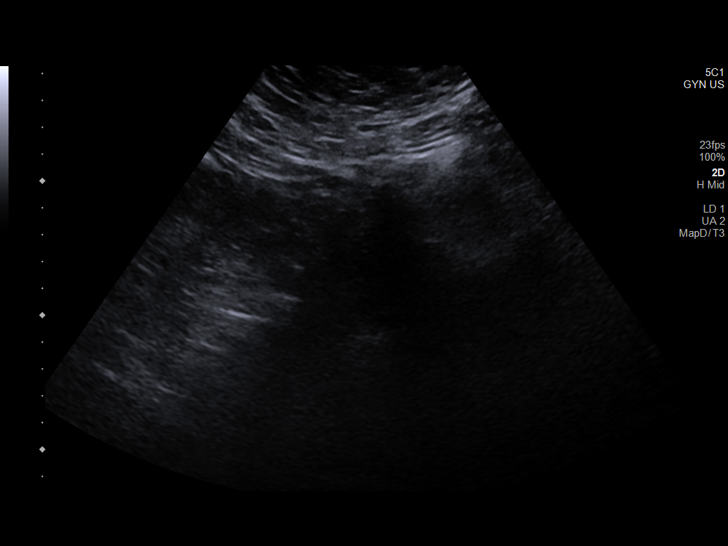
[im 12/69]
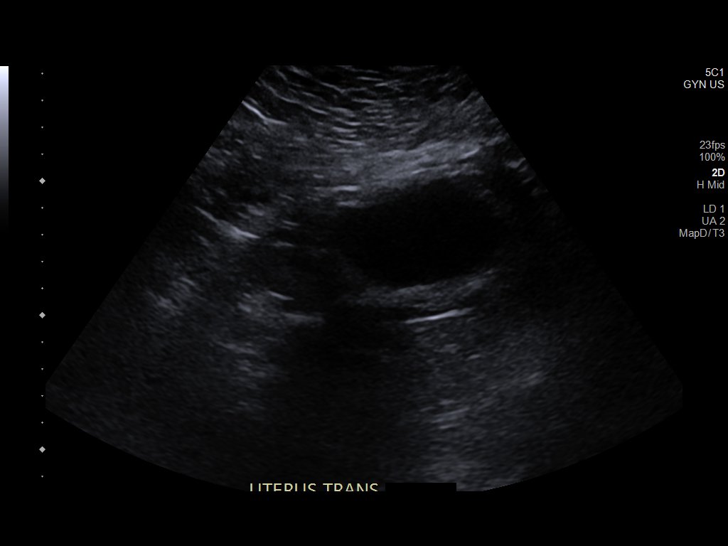
[im 18/69]
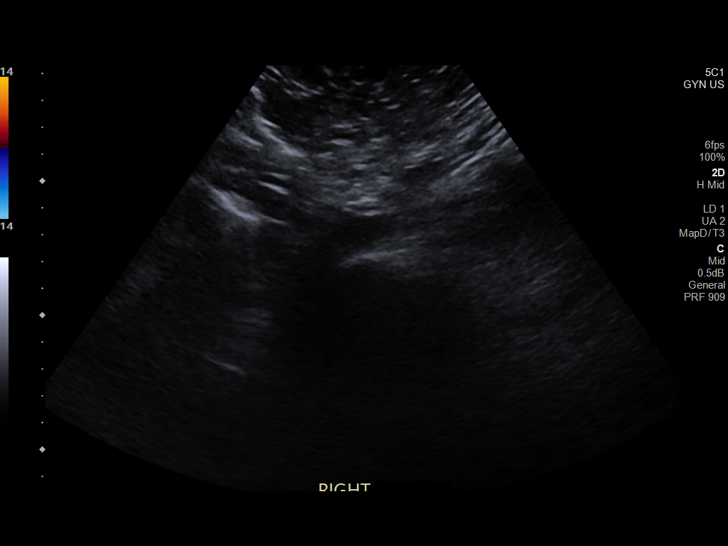
[im 23/69]
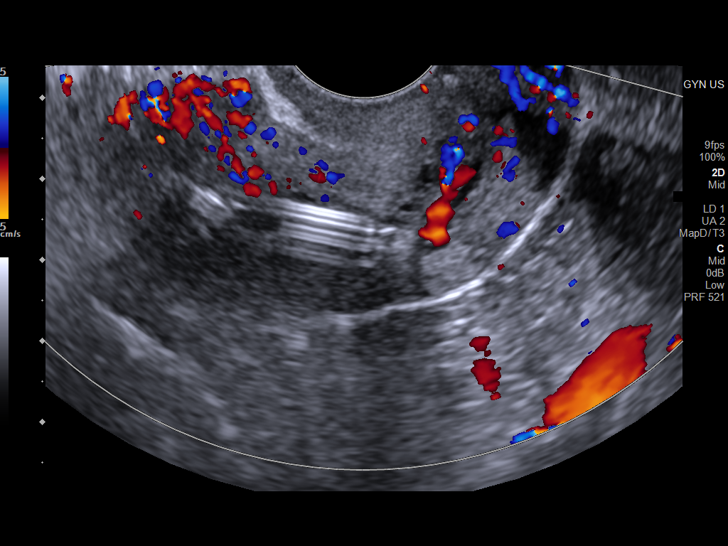
[im 29/69]
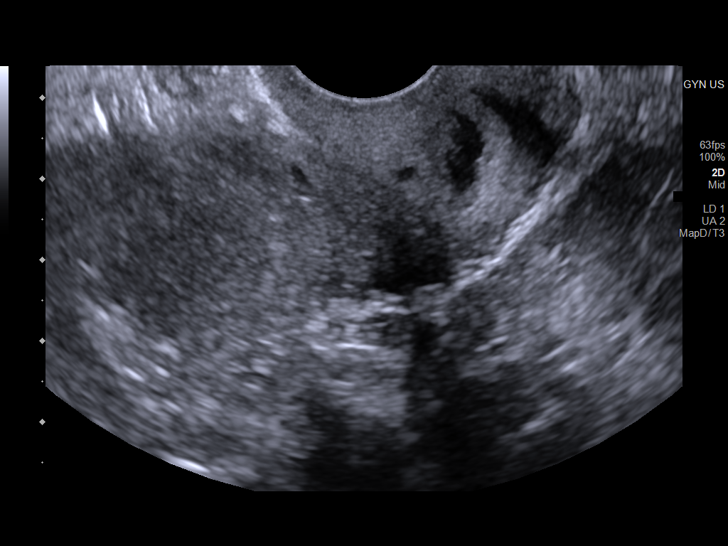
[im 35/69]
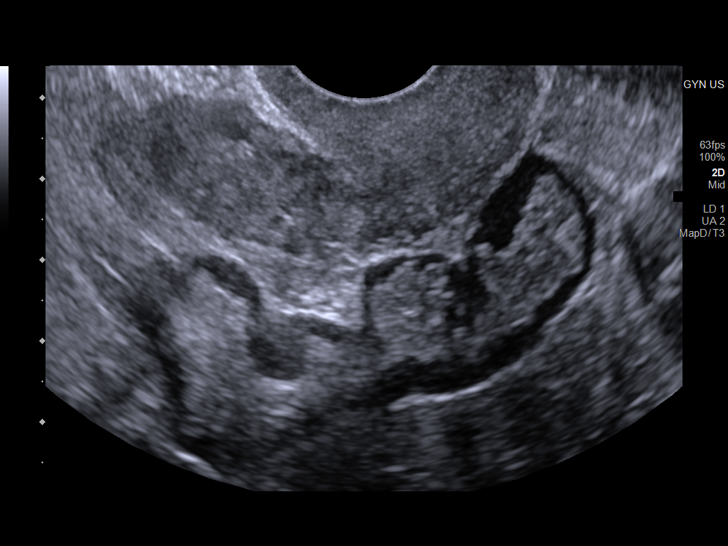
[im 40/69]
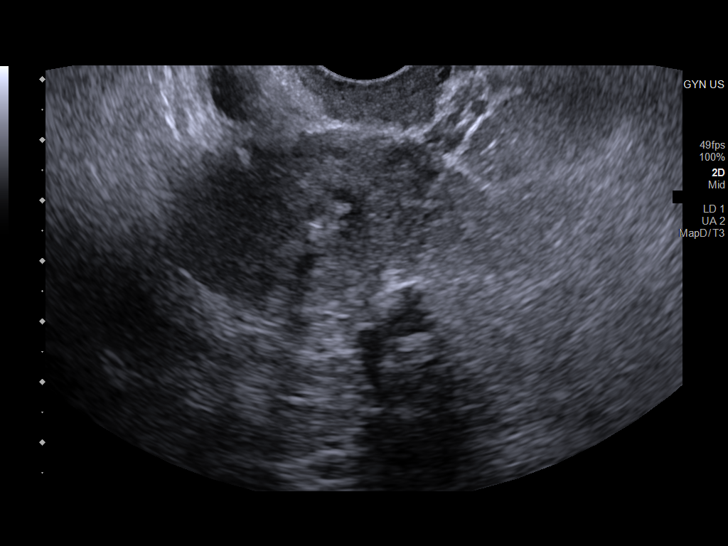
[im 46/69]
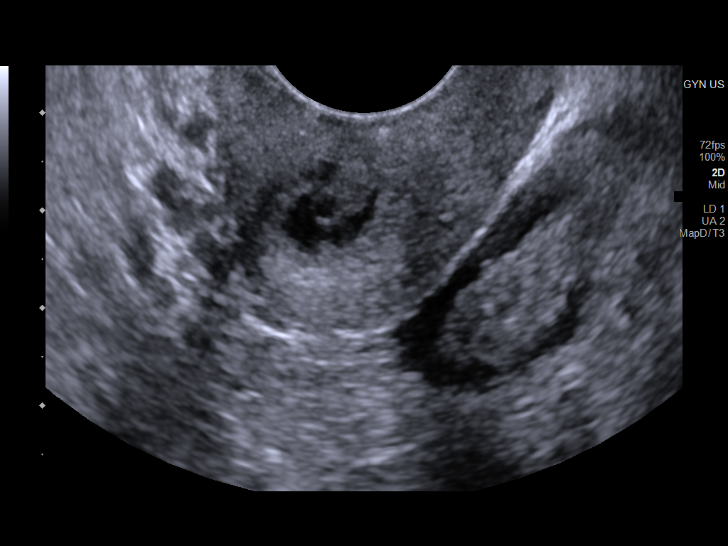
[im 52/69]
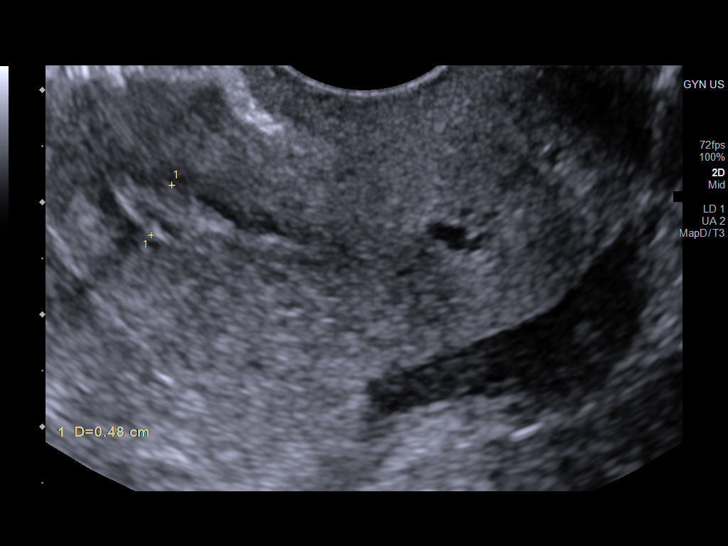
[im 57/69]
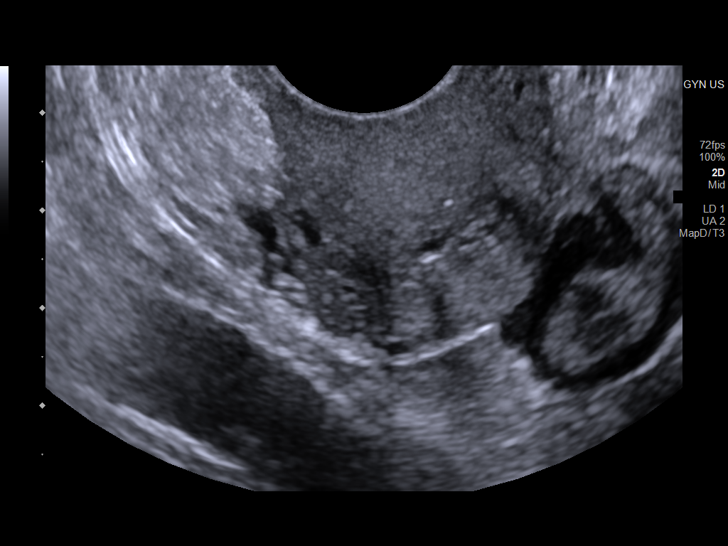
[im 63/69]
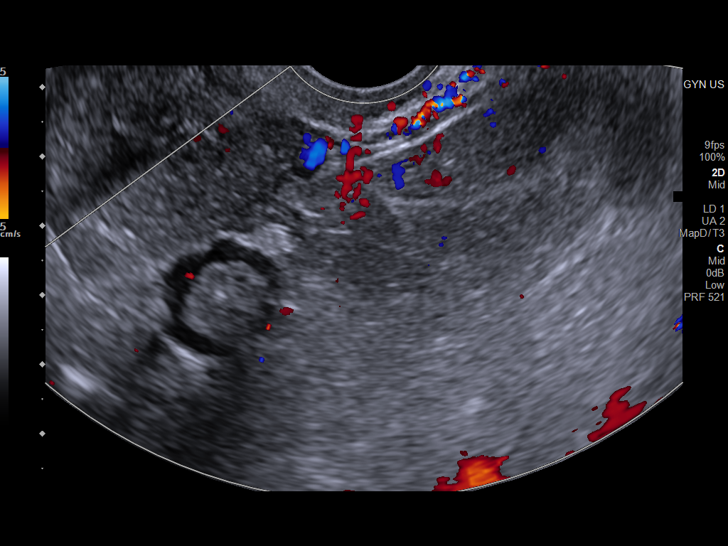
[im 69/69]
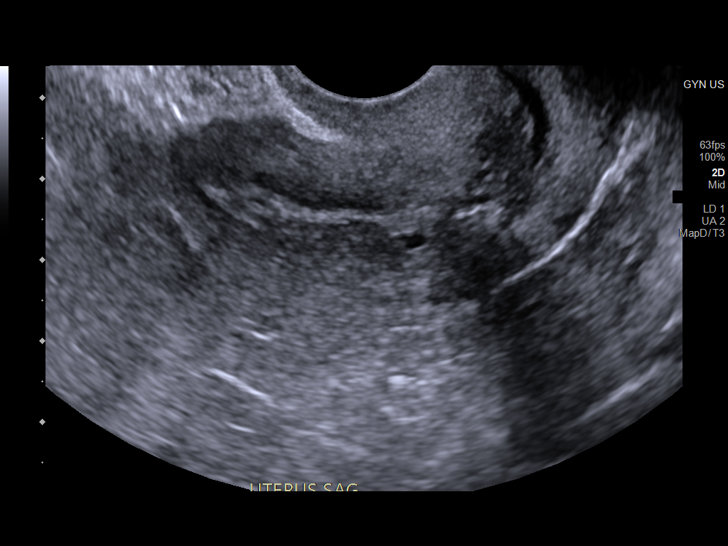

[13 of 25 positions shown; findings below may reference images not displayed]

FINDINGS: Uterus

Measurements: 6.5 x 3.7 x 4.4 cm = volume: 56 mL. Anteverted.
Heterogeneous myometrium. No definite focal mass

Endometrium

Thickness: 5 mm. IUD in expected position at the upper uterine
segment endometrial canal. No focal mass. Small amount of fluid
within the endocervical canal.

Right ovary

Not visualized, likely obscured by bowel

Left ovary

Not visualized, likely obscured by bowel

Other findings

No free pelvic fluid. No adnexal masses. Bowel loops are seen in the
adnexa.
IMPRESSION: IUD in expected position in the upper uterine segment endometrial
canal.

Uterus otherwise normal appearance.

Nonvisualization of ovaries.

## 2022-07-02 DIAGNOSIS — Z0100 Encounter for examination of eyes and vision without abnormal findings: Secondary | ICD-10-CM | POA: Diagnosis not present

## 2022-07-02 DIAGNOSIS — H524 Presbyopia: Secondary | ICD-10-CM | POA: Diagnosis not present

## 2022-07-02 DIAGNOSIS — H35363 Drusen (degenerative) of macula, bilateral: Secondary | ICD-10-CM | POA: Diagnosis not present

## 2022-08-27 NOTE — Progress Notes (Signed)
Office Visit Note  Patient: [name]             Date of Birth: 05-04-1955           MRN: [id number]             PCP: Estanislado Pandy, MD Referring: Estanislado Pandy, MD Visit Date: 09/10/2022   Subjective:  Follow-up   History of Present Illness: [name] is a 67 y.o. female here for follow up for generalized osteoarthritis and some left leg swelling suspected as thrombophlebitis.  Steroid injections for both knees last visit with improvement she felt was a little bit longer than after the first injections, at least one month of a good relief. She also continues taking tylenol regularly and topical voltaren and topical heat as needed especially for the left knee. 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  Previous HPI 06/11/2022 [name] is a 67 y.o. female here for follow up follow-up of generalized osteoarthritis and some left leg swelling suspected as thrombophlebitis.  Since her last visit continues joint pain and stiffness in multiple areas currently most problematic affecting both knees.  Usually worse with prolonged standing and walking.  Tender area of swelling on the left lateral knee has decreased in size and tenderness though not entirely resolved.  No new skin rash or increase in edema.   Previous HPI 04/09/22 [name] is a 67 y.o. female here for follow up for osteoarthritis joint pain involving shoulders and knees.  After repeat steroid injection 2 months ago she had symptom improvement.  Shoulder pain is doing well on both sides though she still limited with her overhead range of motion.  Her knee injection from about 4 months ago starting to notice a little bit more pain and stiffness though still better than prior to treatment.  She has noticed some increased swelling in her left leg and there is a firm nodule or lump under the skin she notices just below the level of the  knee.  Small increase in redness in that leg swelling.  With her starting to notice increased left knee symptoms again she has questions about viscosupplementation treatment.   Previous HPI 02/12/22 [name] is a 67 y.o. female here for follow up for chronic osteoarthritis pain in multiple areas we did steroid injections in the right shoulder and left knee last month. She reports symptoms improved from 10/10 pain to about 5-6/10 pain after these shots. Also less aching or radiating pain. She has slight improvement in mobility and function in the right shoulder. Since the original shot notices some decline in the benefit so far, back to about 8/10 knee pain. She is interested in trying injection for her other shoulder and knee which remain problematic.   Previous HPI 01/03/22 [name] is a 67 y.o. female here for follow up for osteoarthritis in multiple areas also right shoulder xray suggestive for some subacromial bursitis or chronic injury with displacement or dislocation. Symptoms remain about the same since our initial visit. Right shoulder is the worst and limited ROM is difficult to dress. Knee pain remains most problematic along medial side of her joints.    Previous HPI 12/12/21 [name] is a 67 y.o. female here fgr evaluation of positive ANA and multiple joint pains. She has discontinued NSAIDs due to left sided  colitis episodes without certain etiology established except suspected medication related. Has chronic joint pain worse now in multiple areas workup labs negative for RF, ESR, CRP, but low positive ANA 1:80 with negative ENA panel. She has also described hair loss in a generalized pattern and increased in longitudinal nail ridges these been worse during this recent time since the colitis symptoms.  She has had chronic joint pain in bilateral knees ongoing for years.  She estimates at least 5 or more years been self medicating with Aleve which is very beneficial for her joint  pain and stiffness.  Initially taking daily and then had increased to twice daily consistently for much of this time.  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  She stopped this completely since her colitis problems and these have resolved but in addition to worsening of her knee pain and stiffness also developed trouble in her upper extremities and both shoulders and both hands especially the right shoulder.  Also abruptly developed a palpable knot and swelling in the right shoulder with decreased range of motion in the past year.  She does use a cane for stability and notices leaning on counters or carts more to offload her knees with prolonged standing or walking. She has not noticed any new problem with skin rashes, oral nasal ulcers, Raynaud's symptoms, no abnormal bruising or bleeding outside of the colitis.  She does have dry eye symptoms these are worse when reading or on a screen for prolonged periods but does not require eyedrops for symptoms every day.  She was also found to have very low serum vitamin D level at lab testing earlier this year.   Review of Systems  Constitutional:  Positive for fatigue.  HENT:  Negative for mouth sores and mouth dryness.   Eyes:  Positive for dryness.  Respiratory:  Negative for shortness of breath.   Cardiovascular:  Negative for chest pain and palpitations.  Gastrointestinal:  Negative for blood in stool, constipation and diarrhea.  Endocrine: Negative for increased urination.  Genitourinary:  Negative for involuntary urination.  Musculoskeletal:  Positive for joint pain, gait problem, joint pain, joint swelling, myalgias, morning stiffness and myalgias. Negative for muscle weakness and muscle tenderness.  Skin:  Negative for color change, rash, hair loss and sensitivity to sunlight.  Allergic/Immunologic: Negative for susceptible to infections.  Neurological:  Negative for dizziness  and headaches.  Hematological:  Negative for swollen glands.  Psychiatric/Behavioral:  Positive for depressed mood. Negative for sleep disturbance. The patient is nervous/anxious.     PMFS History:  Patient Active Problem List   Diagnosis Date Noted   Thrombophlebitis 04/09/2022   Generalized osteoarthritis of multiple sites 01/03/2022   Bilateral shoulder pain 01/03/2022   Positive ANA (antinuclear antibody) 12/12/2021   Vitamin D deficiency 12/12/2021   Joint pain 09/05/2021   Multiple joint pain 09/05/2021   Hair loss 09/05/2021   Colitis 01/17/2021   Rectal bleeding 02/10/2020   Gastric polyps 02/10/2020   GERD (gastroesophageal reflux disease) 02/10/2020   Diarrhea 02/10/2020   Postmenopausal bleeding 07/20/2019   Symptomatic cholelithiasis 11/27/2018   Carpal tunnel syndrome on left 12/14/2014   Endometrial 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, complex 04/20/2014    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   GERD (gastroesophageal reflux disease)    History of  kidney stones 07/2014   Plantar fascial fibromatosis of left foot    Sinus problem     Family History  Problem Relation Age of Onset   Diabetes Mother    Stroke Mother    Osteoarthritis Mother    Heart disease Father    Cancer Maternal Aunt        Breast   Colon cancer Neg Hx    Gastric cancer Neg Hx    Esophageal cancer Neg Hx    Past Surgical History:  Procedure Laterality Date   BIOPSY  09/04/2020   Procedure: BIOPSY;  Surgeon: Lanelle Bal, DO;  Location: [facility];  Service: Endoscopy;;  descending colon polyp   CARPAL TUNNEL RELEASE Right 10/04/2014   Procedure: RIGHT CARPAL TUNNEL RELEASE;  Surgeon: Cindee Salt, MD;  Location: [facility];  Service: Orthopedics;  Laterality: Right;  REG/FAB   CARPAL TUNNEL RELEASE Left 11/15/2014   Procedure: LEFT CARPAL TUNNEL RELEASE;  Surgeon: Cindee Salt, MD;   Location:  SURGERY CENTER;  Service: Orthopedics;  Laterality: Left;   CERVICAL CONIZATION W/BX  04/2014   CHOLECYSTECTOMY  11/30/2014   COLONOSCOPY WITH PROPOFOL N/A 09/04/2020   Procedure: COLONOSCOPY WITH PROPOFOL;  Surgeon: Lanelle Bal, DO;  Location: [facility];  Service: Endoscopy;  Laterality: N/A;  7:30am   DILATION AND CURETTAGE OF UTERUS  04/2014   DILATION AND CURETTAGE OF UTERUS N/A 08/10/2019   Procedure: DILATATION AND CURETTAGE OF THE UTERUS UNDER ULTRASOUND GUIDANCE;  Surgeon: Adolphus Birchwood, MD;  Location: [facility];  Service: Gynecology;  Laterality: N/A;   HYMENECTOMY  age 47 or age 39   hysteroscoptic insertion of mirena IUD  08/2015   HYSTEROSCOPY  02/2014   INTRAUTERINE DEVICE (IUD) INSERTION N/A 08/10/2019   Procedure: INTRAUTERINE DEVICE (IUD) REMOVAL AND REINSERTION;  Surgeon: Adolphus Birchwood, MD;  Location: [facility] [place];  Service: Gynecology;  Laterality: N/A;  PHARMACY MIRENA IUD   OPERATIVE ULTRASOUND N/A 08/10/2019   Procedure: OPERATIVE ULTRASOUND;  Surgeon: Adolphus Birchwood, MD;  Location: [facility];  Service: Gynecology;  Laterality: N/A;   uterine polyps removed  06/03/2018   VARICOSE VEIN SURGERY Bilateral 15 yrs ago   tx with injections only no sx   WISDOM TOOTH EXTRACTION     as a teenager   Social History   Social History Narrative   Not on file    There is no immunization history on file for this patient.   Objective: Vital Signs: BP 138/85 (BP Location: Right Arm, Patient Position: Sitting, Cuff Size: Normal)   Pulse 65   Resp 12   Ht 5' (1.524 m)   Wt 237 lb (107.5 kg)   BMI 46.29 kg/m    Physical Exam Constitutional:      Appearance: She is obese.  Cardiovascular:     Rate and Rhythm: Normal rate and regular rhythm.  Pulmonary:     Effort: Pulmonary effort is normal.     Breath sounds: Normal breath sounds.  Skin:    General: Skin is warm and dry.     Findings: No rash.      Comments: Tortuous superficial veins and varicosities, no tenderness no erythema  Neurological:     Mental Status: She is alert.  Psychiatric:        Mood and Affect: Mood normal.      Musculoskeletal Exam:  Significantly restricted shoulder range of motion especially with overhead abduction and external rotation, right humerus  in low position, no palpable swelling Elbows full ROM no tenderness or swelling Wrists full ROM no tenderness or swelling Fingers full ROM no tenderness or swelling Knees tenderness on full flexion and extension and joint line tenderness to pressure, worse on left side, no palpable swelling, bilateral patellofemoral crepitus present   Investigation: No additional findings.  Imaging: No results found.  Recent Labs: Lab Results  Component Value Date   WBC 5.8 09/05/2021   HGB 11.9 09/05/2021   PLT 323 09/05/2021   NA 140 09/05/2021   K 4.4 09/05/2021   CL 106 09/05/2021   CO2 23 09/05/2021   GLUCOSE 85 09/05/2021   BUN 14 09/05/2021   CREATININE 0.70 09/05/2021   BILITOT 0.5 09/05/2021   AST 12 09/05/2021   ALT 8 09/05/2021   PROT 7.0 09/05/2021   CALCIUM 9.4 09/05/2021   GFRAA >60 08/06/2019    Speciality Comments: No specialty comments available.  Procedures:  Large Joint Inj: bilateral glenohumeral on 09/10/2022 11:00 AM Indications: pain Details: 27 G 1.5 in needle, posterior approach Medications (Right): 2 mL lidocaine 1 %; 40 mg triamcinolone acetonide 40 MG/ML Medications (Left): 2 mL lidocaine 1 %; 40 mg triamcinolone acetonide 40 MG/ML Outcome: tolerated well, no immediate complications Procedure, treatment alternatives, risks and benefits explained, specific risks discussed. Consent was given by the patient. Immediately prior to procedure a time out was called to verify the correct patient, procedure, equipment, support staff and site/side marked as required. Patient was prepped and draped in the usual sterile fashion.      Allergies: Aleve [naproxen]   Assessment / Plan:     Visit Diagnoses: Generalized osteoarthritis of multiple sites - S/P Large Joint Inj: bilateral knee on 06/07/2022. Taking turmeric and using topical Voltaren and Tylenol as needed which are partially helpful.  Knee osteoarthritis benefit of intra-articular steroids for about 1 month duration.  Has not tried several rounds of injections without a more prolonged response.  Not a candidate for oral NSAIDs due to renal disease.  Already using topical Voltaren and Tylenol is much as safely recommended.  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  Please should be candidate for viscosupplementation injections with limited duration of benefit on repeat steroids.  Thrombophlebitis  The previous tenderness and erythema at the leg has resolved not requiring any additional treatment now.  Chronic pain of both shoulders - Plan: Large Joint Inj: bilateral glenohumeral  Bilateral shoulder pain with severe degenerative arthritis and chronic displacement in the right shoulder.  She previously had a benefit with intra-articular steroid injections but less last year and no longer effective.  I believe any benefit from physical therapy would be extremely limited given the severe underlying structural deficiencies.  Repeat intra-articular steroid injections today.  Orders: Orders Placed This Encounter  Procedures   Large Joint Inj: bilateral glenohumeral   No orders of the defined types were placed in this encounter.    Follow-Up Instructions: No follow-ups on file.   Fuller Plan, MD  Note - This record has been created using [employer].  Chart creation errors have been sought, but may not always  have been located. Such creation errors do not reflect on   the standard of medical care.

## 2022-09-10 ENCOUNTER — Ambulatory Visit: Payer: Medicare HMO | Attending: Internal Medicine | Admitting: Internal Medicine

## 2022-09-10 ENCOUNTER — Encounter: Payer: Self-pay | Admitting: Internal Medicine

## 2022-09-10 VITALS — BP 138/85 | HR 65 | Resp 12 | Ht 60.0 in | Wt 237.0 lb

## 2022-09-10 DIAGNOSIS — M25512 Pain in left shoulder: Secondary | ICD-10-CM | POA: Diagnosis not present

## 2022-09-10 DIAGNOSIS — G8929 Other chronic pain: Secondary | ICD-10-CM | POA: Diagnosis not present

## 2022-09-10 DIAGNOSIS — M25511 Pain in right shoulder: Secondary | ICD-10-CM | POA: Diagnosis not present

## 2022-09-10 DIAGNOSIS — M159 Polyosteoarthritis, unspecified: Secondary | ICD-10-CM

## 2022-09-10 DIAGNOSIS — I809 Phlebitis and thrombophlebitis of unspecified site: Secondary | ICD-10-CM

## 2022-09-10 MED ORDER — LIDOCAINE HCL 1 % IJ SOLN
2.0000 mL | INTRAMUSCULAR | Status: AC | PRN
Start: 2022-09-10 — End: 2022-09-10
  Administered 2022-09-10: 2 mL

## 2022-09-10 MED ORDER — TRIAMCINOLONE ACETONIDE 40 MG/ML IJ SUSP
40.0000 mg | INTRAMUSCULAR | Status: AC | PRN
Start: 2022-09-10 — End: 2022-09-10
  Administered 2022-09-10: 40 mg via INTRA_ARTICULAR

## 2022-10-03 ENCOUNTER — Telehealth: Payer: Self-pay | Admitting: Internal Medicine

## 2022-10-03 NOTE — Telephone Encounter (Signed)
Patient called checking the status of her gel injections for her bilateral knees.  Patient states Dr. Dimple Casey told her to contact the office if she didn't receive a call to schedule the appointments.

## 2022-10-04 NOTE — Telephone Encounter (Signed)
VOB submitted for SynviscOne, Bilateral knee(s) BV pending

## 2022-10-04 NOTE — Telephone Encounter (Signed)
Yes. We planned to go for bilateral Visco injections next if second steroid is not lasting long.

## 2022-10-15 NOTE — Telephone Encounter (Signed)
Please call to schedule visco injections.  Approved for SynviscOne, Bilateral knee(s). Buy & Bill No co-pay Deductible does not apply Once the OOP has been met $4500 (remaining $4395.01) patient is covered at 100% Coverage was approved by [facility] Case #[id number] ID #[id number] Authorization date: 10/09/2022 to 04/08/2023

## 2022-10-16 NOTE — Telephone Encounter (Signed)
LMOM for patient to call and schedule SynviscOne injections for both knees.

## 2022-10-21 DIAGNOSIS — Z23 Encounter for immunization: Secondary | ICD-10-CM | POA: Diagnosis not present

## 2022-11-12 DIAGNOSIS — H40013 Open angle with borderline findings, low risk, bilateral: Secondary | ICD-10-CM | POA: Diagnosis not present

## 2022-11-19 ENCOUNTER — Ambulatory Visit: Payer: Medicare HMO | Attending: Internal Medicine | Admitting: Internal Medicine

## 2022-11-19 DIAGNOSIS — M170 Bilateral primary osteoarthritis of knee: Secondary | ICD-10-CM

## 2022-11-19 MED ORDER — LIDOCAINE HCL 1 % IJ SOLN
2.0000 mL | INTRAMUSCULAR | Status: AC | PRN
Start: 2022-11-19 — End: 2022-11-19
  Administered 2022-11-19: 2 mL

## 2022-11-19 MED ORDER — HYLAN G-F 20 48 MG/6ML IX SOSY
48.0000 mg | PREFILLED_SYRINGE | INTRA_ARTICULAR | Status: AC | PRN
Start: 2022-11-19 — End: 2022-11-19
  Administered 2022-11-19: 48 mg via INTRA_ARTICULAR

## 2022-11-19 NOTE — Progress Notes (Signed)
   Procedure Note  Patient: [name]             Date of Birth: 01-Jun-1955           MRN: [id number]             Visit Date: 11/19/2022  Procedures: Visit Diagnoses:  1. Bilateral primary osteoarthritis of knee     Large Joint Inj: bilateral knee on 11/19/2022 2:40 PM Indications: pain Details: (21g) 1.5 in needle, medial approach Medications (Right): 48 mg Hylan G-F 20 48 MG/6ML; 2 mL lidocaine 1 % Medications (Left): 48 mg Hylan G-F 20 48 MG/6ML; 2 mL lidocaine 1 % Procedure, treatment alternatives, risks and benefits explained, specific risks discussed. Consent was given by the patient. Immediately prior to procedure a time out was called to verify the correct patient, procedure, equipment, support staff and site/side marked as required. Patient was prepped and draped in the usual sterile fashion.     [name] presented today for bilateral knee viscosupplementation injections for chronic osteoarthritis pain.  She is unable to take oral NSAIDs due to colitis.  We have tried intra-articular steroid injections twice with very limited duration of benefit.  Still having knee pain on both sides left worse than right.  Injection to both knees today well-tolerated without complication.

## 2023-04-15 DIAGNOSIS — I471 Supraventricular tachycardia, unspecified: Secondary | ICD-10-CM

## 2023-04-15 HISTORY — DX: Supraventricular tachycardia, unspecified: I47.10

## 2023-04-16 ENCOUNTER — Encounter: Payer: Self-pay | Admitting: Gastroenterology

## 2023-05-05 ENCOUNTER — Other Ambulatory Visit: Payer: Self-pay

## 2023-05-05 ENCOUNTER — Emergency Department (HOSPITAL_COMMUNITY)

## 2023-05-05 ENCOUNTER — Encounter (HOSPITAL_COMMUNITY): Payer: Self-pay

## 2023-05-05 ENCOUNTER — Inpatient Hospital Stay (HOSPITAL_COMMUNITY)
Admission: EM | Admit: 2023-05-05 | Discharge: 2023-05-07 | DRG: 309 | Disposition: A | Discharge status: Home / Self Care | Attending: Family Medicine | Admitting: Family Medicine

## 2023-05-05 ENCOUNTER — Ambulatory Visit
Admission: RE | Admit: 2023-05-05 | Discharge: 2023-05-05 | Disposition: A | Discharge status: Other Acute Inpatient Hospital | Source: Ambulatory Visit

## 2023-05-05 VITALS — BP 137/90 | HR 155 | Temp 98.4°F | Resp 18

## 2023-05-05 DIAGNOSIS — J019 Acute sinusitis, unspecified: Secondary | ICD-10-CM

## 2023-05-05 DIAGNOSIS — Z87442 Personal history of urinary calculi: Secondary | ICD-10-CM | POA: Diagnosis not present

## 2023-05-05 DIAGNOSIS — Z1152 Encounter for screening for COVID-19: Secondary | ICD-10-CM | POA: Diagnosis not present

## 2023-05-05 DIAGNOSIS — R000 Tachycardia, unspecified: Secondary | ICD-10-CM | POA: Diagnosis not present

## 2023-05-05 DIAGNOSIS — Z860100 Personal history of colon polyps, unspecified: Secondary | ICD-10-CM

## 2023-05-05 DIAGNOSIS — Z823 Family history of stroke: Secondary | ICD-10-CM

## 2023-05-05 DIAGNOSIS — Z743 Need for continuous supervision: Secondary | ICD-10-CM | POA: Diagnosis not present

## 2023-05-05 DIAGNOSIS — Z886 Allergy status to analgesic agent status: Secondary | ICD-10-CM | POA: Diagnosis not present

## 2023-05-05 DIAGNOSIS — F419 Anxiety disorder, unspecified: Secondary | ICD-10-CM | POA: Diagnosis not present

## 2023-05-05 DIAGNOSIS — R051 Acute cough: Secondary | ICD-10-CM | POA: Diagnosis not present

## 2023-05-05 DIAGNOSIS — Z79899 Other long term (current) drug therapy: Secondary | ICD-10-CM

## 2023-05-05 DIAGNOSIS — I4719 Other supraventricular tachycardia: Secondary | ICD-10-CM | POA: Diagnosis not present

## 2023-05-05 DIAGNOSIS — R0602 Shortness of breath: Secondary | ICD-10-CM | POA: Diagnosis not present

## 2023-05-05 DIAGNOSIS — R002 Palpitations: Secondary | ICD-10-CM | POA: Diagnosis not present

## 2023-05-05 DIAGNOSIS — Z6841 Body Mass Index (BMI) 40.0 and over, adult: Secondary | ICD-10-CM | POA: Diagnosis not present

## 2023-05-05 DIAGNOSIS — E785 Hyperlipidemia, unspecified: Secondary | ICD-10-CM | POA: Diagnosis present

## 2023-05-05 DIAGNOSIS — R9431 Abnormal electrocardiogram [ECG] [EKG]: Secondary | ICD-10-CM | POA: Diagnosis not present

## 2023-05-05 DIAGNOSIS — I4891 Unspecified atrial fibrillation: Secondary | ICD-10-CM | POA: Diagnosis not present

## 2023-05-05 DIAGNOSIS — I471 Supraventricular tachycardia, unspecified: Secondary | ICD-10-CM

## 2023-05-05 DIAGNOSIS — Z8249 Family history of ischemic heart disease and other diseases of the circulatory system: Secondary | ICD-10-CM

## 2023-05-05 DIAGNOSIS — Z9049 Acquired absence of other specified parts of digestive tract: Secondary | ICD-10-CM

## 2023-05-05 DIAGNOSIS — K219 Gastro-esophageal reflux disease without esophagitis: Secondary | ICD-10-CM | POA: Diagnosis present

## 2023-05-05 DIAGNOSIS — D649 Anemia, unspecified: Secondary | ICD-10-CM | POA: Diagnosis present

## 2023-05-05 DIAGNOSIS — R0600 Dyspnea, unspecified: Secondary | ICD-10-CM | POA: Diagnosis not present

## 2023-05-05 DIAGNOSIS — R0689 Other abnormalities of breathing: Secondary | ICD-10-CM | POA: Diagnosis not present

## 2023-05-05 LAB — COMPREHENSIVE METABOLIC PANEL WITH GFR
ALT: 11 U/L (ref 0–44)
AST: 15 U/L (ref 15–41)
Albumin: 3.3 g/dL — ABNORMAL LOW (ref 3.5–5.0)
Alkaline Phosphatase: 46 U/L (ref 38–126)
Anion gap: 9 (ref 5–15)
BUN: 17 mg/dL (ref 8–23)
CO2: 23 mmol/L (ref 22–32)
Calcium: 8.5 mg/dL — ABNORMAL LOW (ref 8.9–10.3)
Chloride: 107 mmol/L (ref 98–111)
Creatinine, Ser: 0.63 mg/dL (ref 0.44–1.00)
GFR, Estimated: >60 mL/min (ref >60)
Glucose, Bld: 93 mg/dL (ref 70–99)
Potassium: 3.9 mmol/L (ref 3.5–5.1)
Sodium: 139 mmol/L (ref 135–145)
Total Bilirubin: 0.4 mg/dL (ref 0.0–1.2)
Total Protein: 6.6 g/dL (ref 6.5–8.1)

## 2023-05-05 LAB — CBC WITH DIFFERENTIAL/PLATELET
Abs Immature Granulocytes: 0.01 10*3/uL (ref 0.00–0.07)
Basophils Absolute: 0 10*3/uL (ref 0.0–0.1)
Basophils Relative: 0 %
Eosinophils Absolute: 0.2 10*3/uL (ref 0.0–0.5)
Eosinophils Relative: 4 %
HCT: 33.7 % — ABNORMAL LOW (ref 36.0–46.0)
Hemoglobin: 10.4 g/dL — ABNORMAL LOW (ref 12.0–15.0)
Immature Granulocytes: 0 %
Lymphocytes Relative: 43 %
Lymphs Abs: 2.2 10*3/uL (ref 0.7–4.0)
MCH: 28.1 pg (ref 26.0–34.0)
MCHC: 30.9 g/dL (ref 30.0–36.0)
MCV: 91.1 fL (ref 80.0–100.0)
Monocytes Absolute: 0.4 10*3/uL (ref 0.1–1.0)
Monocytes Relative: 8 %
Neutro Abs: 2.3 10*3/uL (ref 1.7–7.7)
Neutrophils Relative %: 45 %
Platelets: 267 10*3/uL (ref 150–400)
RBC: 3.7 MIL/uL — ABNORMAL LOW (ref 3.87–5.11)
RDW: 14.6 % (ref 11.5–15.5)
WBC: 5.1 10*3/uL (ref 4.0–10.5)
nRBC: 0 % (ref 0.0–0.2)

## 2023-05-05 LAB — BRAIN NATRIURETIC PEPTIDE: B Natriuretic Peptide: 147 pg/mL — ABNORMAL HIGH (ref 0.0–100.0)

## 2023-05-05 LAB — TROPONIN I (HIGH SENSITIVITY): Troponin I (High Sensitivity): 12 ng/L (ref <18)

## 2023-05-05 LAB — TSH: TSH: 1.359 u[IU]/mL (ref 0.350–4.500)

## 2023-05-05 LAB — MRSA NEXT GEN BY PCR, NASAL: MRSA by PCR Next Gen: NOT DETECTED

## 2023-05-05 LAB — MAGNESIUM: Magnesium: 2 mg/dL (ref 1.7–2.4)

## 2023-05-05 MED ORDER — AMOXICILLIN-POT CLAVULANATE 875-125 MG PO TABS: 1.0000 | ORAL_TABLET | Freq: Two times a day (BID) | ORAL | 0 refills | Status: DC

## 2023-05-05 MED ORDER — ONDANSETRON HCL 4 MG/2ML IJ SOLN: 4.0000 mg | Freq: Four times a day (QID) | INTRAMUSCULAR | Status: DC | PRN

## 2023-05-05 MED ORDER — SODIUM CHLORIDE 0.9 % IV BOLUS
500.0000 mL | Freq: Once | INTRAVENOUS | Status: AC
  Administered 2023-05-05: 500 mL via INTRAVENOUS

## 2023-05-05 MED ORDER — ADENOSINE 6 MG/2ML IV SOLN
6.0000 mg | Freq: Once | INTRAVENOUS | Status: AC
Start: 2023-05-05 — End: 2023-05-05
  Administered 2023-05-05: 6 mg via INTRAVENOUS

## 2023-05-05 MED ORDER — ENOXAPARIN SODIUM 40 MG/0.4ML IJ SOSY: 40.0000 mg | PREFILLED_SYRINGE | INTRAMUSCULAR | Status: DC

## 2023-05-05 MED ORDER — METOPROLOL TARTRATE 5 MG/5ML IV SOLN
5.0000 mg | Freq: Four times a day (QID) | INTRAVENOUS | Status: DC | PRN
  Administered 2023-05-05 – 2023-05-07 (×2): 5 mg via INTRAVENOUS
  Filled 2023-05-05 (×3): qty 5

## 2023-05-05 MED ORDER — DILTIAZEM HCL-DEXTROSE 125-5 MG/125ML-% IV SOLN (PREMIX)
5.0000 mg/h | INTRAVENOUS | Status: DC
  Administered 2023-05-05: 5 mg/h via INTRAVENOUS
  Filled 2023-05-05: qty 125

## 2023-05-05 MED ORDER — ADENOSINE 6 MG/2ML IV SOLN
12.0000 mg | Freq: Once | INTRAVENOUS | Status: AC
Start: 2023-05-05 — End: 2023-05-05

## 2023-05-05 MED ORDER — ACETAMINOPHEN 325 MG PO TABS: 650.0000 mg | ORAL_TABLET | Freq: Four times a day (QID) | ORAL | Status: DC | PRN

## 2023-05-05 MED ORDER — FLUOXETINE HCL 20 MG PO CAPS
20.0000 mg | ORAL_CAPSULE | ORAL | Status: DC
  Administered 2023-05-06: 20 mg via ORAL
  Filled 2023-05-05: qty 1

## 2023-05-05 MED ORDER — AMOXICILLIN-POT CLAVULANATE 875-125 MG PO TABS
1.0000 | ORAL_TABLET | Freq: Two times a day (BID) | ORAL | Status: DC
  Administered 2023-05-05 – 2023-05-07 (×4): 1 via ORAL
  Filled 2023-05-05 (×4): qty 1

## 2023-05-05 MED ORDER — ENOXAPARIN SODIUM 60 MG/0.6ML IJ SOSY
55.0000 mg | PREFILLED_SYRINGE | INTRAMUSCULAR | Status: DC
  Administered 2023-05-05 – 2023-05-06 (×2): 55 mg via SUBCUTANEOUS
  Filled 2023-05-05 (×2): qty 0.6

## 2023-05-05 MED ORDER — FLUOXETINE HCL 20 MG PO CAPS
40.0000 mg | ORAL_CAPSULE | ORAL | Status: DC
  Administered 2023-05-07: 40 mg via ORAL
  Filled 2023-05-05: qty 2

## 2023-05-05 MED ORDER — PANTOPRAZOLE SODIUM 40 MG PO TBEC
40.0000 mg | DELAYED_RELEASE_TABLET | Freq: Every day | ORAL | Status: DC
  Administered 2023-05-06 – 2023-05-07 (×2): 40 mg via ORAL
  Filled 2023-05-05 (×2): qty 1

## 2023-05-05 MED ORDER — CHLORHEXIDINE GLUCONATE CLOTH 2 % EX PADS
6.0000 | MEDICATED_PAD | Freq: Every day | CUTANEOUS | Status: DC
  Administered 2023-05-06 – 2023-05-07 (×2): 6 via TOPICAL

## 2023-05-05 MED ORDER — ROSUVASTATIN CALCIUM 5 MG PO TABS
5.0000 mg | ORAL_TABLET | Freq: Every day | ORAL | Status: DC
  Administered 2023-05-05 – 2023-05-07 (×3): 5 mg via ORAL
  Filled 2023-05-05 (×3): qty 1

## 2023-05-05 MED ORDER — ACETAMINOPHEN 650 MG RE SUPP: 650.0000 mg | Freq: Four times a day (QID) | RECTAL | Status: DC | PRN

## 2023-05-05 MED ORDER — ADENOSINE 6 MG/2ML IV SOLN
12.0000 mg | Freq: Once | INTRAVENOUS | Status: AC
  Administered 2023-05-05: 12 mg via INTRAVENOUS

## 2023-05-05 MED ORDER — ADENOSINE 6 MG/2ML IV SOLN
6.0000 mg | Freq: Once | INTRAVENOUS | Status: AC
  Administered 2023-05-05: 6 mg via INTRAVENOUS

## 2023-05-05 MED ORDER — DILTIAZEM LOAD VIA INFUSION
20.0000 mg | Freq: Once | INTRAVENOUS | Status: AC
  Administered 2023-05-05: 20 mg via INTRAVENOUS
  Filled 2023-05-05: qty 20

## 2023-05-05 MED ORDER — ONDANSETRON HCL 4 MG PO TABS: 4.0000 mg | ORAL_TABLET | Freq: Four times a day (QID) | ORAL | Status: DC | PRN

## 2023-05-05 MED ORDER — FLUTICASONE PROPIONATE 50 MCG/ACT NA SUSP: 2.0000 | Freq: Every day | NASAL | 0 refills | Status: AC

## 2023-05-05 MED ORDER — ADENOSINE 6 MG/2ML IV SOLN
INTRAVENOUS | Status: AC
  Administered 2023-05-05: 6 mg via INTRAVENOUS
  Filled 2023-05-05: qty 2

## 2023-05-05 MED ORDER — ADENOSINE 6 MG/2ML IV SOLN
6.0000 mg | Freq: Once | INTRAVENOUS | Status: AC
  Filled 2023-05-05: qty 2

## 2023-05-05 MED ORDER — GUAIFENESIN-DM 100-10 MG/5ML PO SYRP
10.0000 mL | ORAL_SOLUTION | ORAL | Status: DC | PRN
  Administered 2023-05-05 – 2023-05-06 (×3): 10 mL via ORAL
  Filled 2023-05-05 (×3): qty 10

## 2023-05-05 NOTE — Progress Notes (Signed)
   05/05/23 1809  TOC Brief Assessment  Insurance and Status Reviewed  Patient has primary care physician Yes  Home environment has been reviewed From home  Prior level of function: Independent  Prior/Current Home Services No current home services  Social Drivers of Health Review SDOH reviewed no interventions necessary  Readmission risk has been reviewed Yes  Transition of care needs no transition of care needs at this time   Transition of Care Department [facility]) has reviewed patient and no other TOC needs have been identified at this time. We will continue to monitor patient advancement through interdisciplinary progression rounds. If new patient needs arise, please place a TOC consult.

## 2023-05-05 NOTE — Progress Notes (Addendum)
  Patient is back in SVT with heart rate of 150s to 170s -EKG and heart monitor reviewed consistent with SVT -Patient is anxious with some dizziness and palpitations -Gave patient adenosine  6 mg x 1 with conversion to sinus rhythm -IV Cardizem  restarted at 10 mg/h -BP stable -About 15 minutes later patient went back into SVT again - Patient received another IV adenosine  6 mg x 1 and converted back to sinus rhythm again -As of today 05/05/2023 patient has not received maximal dose of adenosine  30 mg - -Less than 25 minutes minutes later patient was back in SVT -IV Cardizem  drip has been titrated to max -BP remained stable Temp:  [97.6 F (36.4 C)-98.4 F (36.9 C)] 97.6 F (36.4 C) (04/21 1634) Pulse Rate:  [71-158] 145 (04/21 1900) Resp:  [8-36] 28 (04/21 1812) BP: (93-140)/(61-99) 118/80 (04/21 1900) SpO2:  [93 %-100 %] 96 % (04/21 1900) Weight:  [103.4 kg-110.8 kg] 110.8 kg (04/21 1503)  - Discussed with primary attending - Total critical care time 39 minutes  Colin Dawley, MD

## 2023-05-05 NOTE — ED Notes (Signed)
 Patient Given Cup of water, ok by [facility]

## 2023-05-05 NOTE — ED Provider Notes (Signed)
 [facility]    CSN: [id number] Arrival date & time: 05/05/23  1003      History   Chief Complaint Chief Complaint  Patient presents with   Cough    Sinus infection, drainage - Entered by patient    HPI [name] is a 68 y.o. female.   The history is provided by the patient.   Patient presents for complaints of sinus drainage, nasal congestion, runny nose, chest congestion and cough.  Patient denies fever, chills, headache, ear pain, wheezing, difficulty breathing, chest pain, abdominal pain, nausea, vomiting, diarrhea, or rash.  Patient reports she has been taking Tylenol  for her symptoms.  During triage, patient's heart rate was noted to be between high 150s to 160s.  Patient states that she does have intermittent shortness of breath, but felt that it was related to her chest congestion. Patient reports that she is under increased stress, states that her 58 year old mother who has dementia lives with her and this is increased her stress level.  Patient denies history of heart disease, high blood pressure, thyroid disease, or lung disease.  She denies history of smoking.  she denies lightheadedness, dizziness, or blurred vision.   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   GERD (gastroesophageal reflux disease)    History of kidney stones 07/2014   Plantar fascial fibromatosis of left foot    Sinus problem     Patient Active Problem List   Diagnosis Date Noted   Thrombophlebitis 04/09/2022   Generalized osteoarthritis of multiple sites 01/03/2022   Bilateral shoulder pain 01/03/2022   Positive ANA (antinuclear antibody) 12/12/2021   Vitamin D  deficiency 12/12/2021   Joint pain 09/05/2021   Multiple joint pain 09/05/2021   Hair loss 09/05/2021   Colitis 01/17/2021   Rectal bleeding 02/10/2020   Gastric polyps 02/10/2020   GERD (gastroesophageal reflux disease) 02/10/2020    Diarrhea 02/10/2020   Postmenopausal bleeding 07/20/2019   Symptomatic cholelithiasis 11/27/2018   Carpal tunnel syndrome on left 12/14/2014   Endometrial 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, complex 04/20/2014    Past Surgical History:  Procedure Laterality Date   BIOPSY  09/04/2020   Procedure: BIOPSY;  Surgeon: Vinetta Greening, DO;  Location: [facility];  Service: Endoscopy;;  descending colon polyp   CARPAL TUNNEL RELEASE Right 10/04/2014   Procedure: RIGHT CARPAL TUNNEL RELEASE;  Surgeon: Lyanne Sample, MD;  Location: [facility];  Service: Orthopedics;  Laterality: Right;  REG/FAB   CARPAL TUNNEL RELEASE Left 11/15/2014   Procedure: LEFT CARPAL TUNNEL RELEASE;  Surgeon: Lyanne Sample, MD;  Location:  SURGERY CENTER;  Service: Orthopedics;  Laterality: Left;   CERVICAL CONIZATION W/BX  04/2014   CHOLECYSTECTOMY  11/30/2014   COLONOSCOPY WITH PROPOFOL  N/A 09/04/2020   Procedure: COLONOSCOPY WITH PROPOFOL ;  Surgeon: Vinetta Greening, DO;  Location: [facility];  Service: Endoscopy;  Laterality: N/A;  7:30am   DILATION AND CURETTAGE OF UTERUS  04/2014   DILATION AND CURETTAGE OF UTERUS N/A 08/10/2019   Procedure: DILATATION AND CURETTAGE OF THE UTERUS UNDER ULTRASOUND GUIDANCE;  Surgeon: Alphonso Aschoff, MD;  Location: [facility];  Service: Gynecology;  Laterality: N/A;   HYMENECTOMY  age 28 or age 36   hysteroscoptic insertion of mirena  IUD  08/2015   HYSTEROSCOPY  02/2014   INTRAUTERINE DEVICE (IUD) INSERTION N/A 08/10/2019   Procedure: INTRAUTERINE DEVICE (  IUD) REMOVAL AND REINSERTION;  Surgeon: Alphonso Aschoff, MD;  Location: [facility];  Service: Gynecology;  Laterality: N/A;  PHARMACY MIRENA  IUD   OPERATIVE ULTRASOUND N/A 08/10/2019   Procedure: OPERATIVE ULTRASOUND;  Surgeon: Alphonso Aschoff, MD;  Location: [facility];  Service: Gynecology;  Laterality: N/A;   uterine polyps removed  06/03/2018   VARICOSE VEIN SURGERY Bilateral 15  yrs ago   tx with injections only no sx   WISDOM TOOTH EXTRACTION     as a teenager    OB History   No obstetric history on file.      Home Medications    Prior to Admission medications   Medication Sig Start Date End Date Taking? Authorizing Provider  acetaminophen  (TYLENOL ) 500 MG tablet Take 1,000 mg by mouth See admin instructions. Take 1000 mg in the morning, may take an additional 1000 mg every 6 hours as needed for pain   Yes [provider]  amoxicillin -clavulanate (AUGMENTIN ) 875-125 MG tablet Take 1 tablet by mouth 2 (two) times daily for 7 days. 05/05/23 05/12/23 Yes Leath-Warren, Belen Bowers, NP  Cholecalciferol (VITAMIN D ) 125 MCG (5000 UT) CAPS Take by mouth.   Yes [provider]  COLLAGEN PO Take by mouth.   Yes [provider]  diclofenac Sodium (VOLTAREN) 1 % GEL Apply 1 application topically 2 (two) times daily.   Yes [provider]  FLUoxetine  (PROZAC ) 20 MG tablet Take 20 mg by mouth daily.   Yes [provider]  fluticasone  (FLONASE ) 50 MCG/ACT nasal spray Place 2 sprays into both nostrils daily. 05/05/23  Yes Leath-Warren, Belen Bowers, NP  omeprazole (PRILOSEC) 20 MG capsule Take 20 mg by mouth daily.   Yes [provider]  rosuvastatin  (CRESTOR ) 5 MG tablet Take by mouth. 03/19/23  Yes [provider]  TURMERIC PO Take by mouth daily.   Yes [provider]    Family History Family History  Problem Relation Age of Onset   Diabetes Mother    Stroke Mother    Osteoarthritis Mother    Heart disease Father    Cancer Maternal Aunt        Breast   Colon cancer Neg Hx    Gastric cancer Neg Hx    Esophageal cancer Neg Hx     Social History Social History   Tobacco Use   Smoking status: Never    Passive exposure: Past   Smokeless tobacco: Never  Vaping Use   Vaping status: Never Used  Substance Use Topics   Alcohol use: No   Drug use: No     Allergies   Aleve  [naproxen]   Review of Systems Review of Systems Per HPI  Physical Exam Triage Vital Signs ED Triage Vitals  Encounter Vitals Group     BP 05/05/23 1015 119/80     Systolic BP Percentile --      Diastolic BP Percentile --      Pulse Rate 05/05/23 1019 (!) 158     Resp 05/05/23 1015 18     Temp 05/05/23 1015 98.4 F (36.9 C)     Temp Source 05/05/23 1015 Oral     SpO2 05/05/23 1015 95 %     Weight --      Height --      Head Circumference --      Peak Flow --      Pain Score 05/05/23 1016 7     Pain Loc --  Pain Education --      Exclude from Growth Chart --    No data found.  Updated Vital Signs BP (!) 137/90 (BP Location: Right Arm)   Pulse (!) 155   Temp 98.4 F (36.9 C) (Oral)   Resp 18   SpO2 97%   Visual Acuity Right Eye Distance:   Left Eye Distance:   Bilateral Distance:    Right Eye Near:   Left Eye Near:    Bilateral Near:     Physical Exam Vitals and nursing note reviewed.  Constitutional:      General: She is not in acute distress.    Appearance: Normal appearance.  HENT:     Head: Normocephalic.     Right Ear: Tympanic membrane, ear canal and external ear normal.     Left Ear: Tympanic membrane, ear canal and external ear normal.     Nose: Congestion present.     Mouth/Throat:     Mouth: Mucous membranes are moist.     Pharynx: No posterior oropharyngeal erythema.     Comments: Cobblestoning present to posterior oropharynx  Eyes:     Extraocular Movements: Extraocular movements intact.     Pupils: Pupils are equal, round, and reactive to light.  Cardiovascular:     Rate and Rhythm: Regular rhythm. Tachycardia present.  Pulmonary:     Effort: Pulmonary effort is normal. No respiratory distress.     Breath sounds: Normal breath sounds. No stridor. No wheezing, rhonchi or rales.  Abdominal:     General: Bowel sounds are normal.     Palpations: Abdomen is soft.     Tenderness: There is no abdominal tenderness.  Musculoskeletal:      Cervical back: Normal range of motion.  Lymphadenopathy:     Cervical: No cervical adenopathy.  Skin:    General: Skin is warm and dry.  Neurological:     General: No focal deficit present.     Mental Status: She is alert and oriented to person, place, and time.  Psychiatric:        Mood and Affect: Mood normal.        Behavior: Behavior normal.      UC Treatments / Results  Labs (all labs ordered are listed, but only abnormal results are displayed) Labs Reviewed - No data to display  EKG: Supraventricular tachycardia, no STEMI.  No other comparisons available.   Radiology No results found.  Procedures Procedures (including critical care time)  Medications Ordered in UC Medications - No data to display  Initial Impression / Assessment and Plan / UC Course  I have reviewed the triage vital signs and the nursing notes.  Pertinent labs & imaging results that were available during my care of the patient were reviewed by me and considered in my medical decision making (see chart for details).  Patient with upper respiratory symptoms have been present for the past week.  Suspect acute sinusitis.  Will treat with Augmentin  875/125 mg tablets twice daily along with fluticasone  50 mcg nasal spray.  For her SVT cannot determine etiology of SVT as patient has no cardiac history, she is not taking any medications for her upper respiratory symptoms, and is stable and we are appearing. EMS was contacted for transport to the ER.  # 20-gauge PIV was established, 2 L via nasal cannula placed.  Patient transported to [facility] emergency department via EMS.  Patient was in agreement with this plan of care and verbalized understanding.  All questions  were answered.  Patient transported to the emergency department.   Final Clinical Impressions(s) / UC Diagnoses   Final diagnoses:  Tachycardia  Supraventricular tachycardia (HCC)  Acute sinusitis, recurrence not specified, unspecified  location     Discharge Instructions      Take medication as directed. Increase fluids and get plenty of rest. May take over-the-counter Tylenol  as needed for pain, fever, or general discomfort. Recommend normal saline nasal spray to help with nasal congestion throughout the day. For your cough, recommend use of a humidifier in your bedroom at nighttime during sleep and sleeping elevated on pillows while cough symptoms persist. If symptoms fail to improve with this treatment, please follow-up with your primary care physician for further evaluation.  Elevated HR: I am sending you to the emergency department for further evaluation of your increased heart rate.      ED Prescriptions     Medication Sig Dispense Auth. Provider   amoxicillin -clavulanate (AUGMENTIN ) 875-125 MG tablet Take 1 tablet by mouth 2 (two) times daily for 7 days. 14 tablet Leath-Warren, Belen Bowers, NP   fluticasone  (FLONASE ) 50 MCG/ACT nasal spray Place 2 sprays into both nostrils daily. 16 g Leath-Warren, Belen Bowers, NP      PDMP not reviewed this encounter.   Hardy Lia, NP 05/05/23 1056

## 2023-05-05 NOTE — ED Provider Notes (Signed)
 [place] EMERGENCY DEPARTMENT AT [facility] Provider Note   CSN: [id number] Arrival date & time: 05/05/23  1109     History  Chief Complaint  Patient presents with   Atrial Fibrillation    [name] is a 68 y.o. female.  Patient is a 68 year old female who presents the emergency department from urgent care secondary to tachycardia.  Patient notes that she was being evaluated at the urgent care secondary to a sinus infection when she was noted to be tachycardic in the 140s.  Patient notes that she has had no chest pain, shortness of breath or palpitations.  She has had no dizziness, lightheadedness or syncope.  She has had no abdominal pain, nausea, vomiting, diarrhea.  She has had no lower extremity edema or hemoptysis.   Atrial Fibrillation Pertinent negatives include no chest pain and no shortness of breath.       Home Medications Prior to Admission medications   Medication Sig Start Date End Date Taking? Authorizing Provider  FLUoxetine  (PROZAC ) 20 MG capsule Take by mouth. 01/17/23  Yes [provider]  acetaminophen  (TYLENOL ) 500 MG tablet Take 1,000 mg by mouth See admin instructions. Take 1000 mg in the morning, may take an additional 1000 mg every 6 hours as needed for pain    [provider]  amoxicillin -clavulanate (AUGMENTIN ) 875-125 MG tablet Take 1 tablet by mouth 2 (two) times daily for 7 days. 05/05/23 05/12/23  Leath-Warren, Belen Bowers, NP  Cholecalciferol (VITAMIN D ) 125 MCG (5000 UT) CAPS Take by mouth.    [provider]  COLLAGEN PO Take by mouth.    [provider]  diclofenac Sodium (VOLTAREN) 1 % GEL Apply 1 application topically 2 (two) times daily.    [provider]  FLUoxetine  (PROZAC ) 20 MG tablet Take 20 mg by mouth daily.    [provider]  fluticasone  (FLONASE ) 50 MCG/ACT nasal spray Place 2 sprays into both nostrils daily. 05/05/23   Leath-Warren, Belen Bowers, NP  omeprazole  (PRILOSEC) 20 MG capsule Take 20 mg by mouth daily.    [provider]  rosuvastatin  (CRESTOR ) 5 MG tablet Take by mouth. 03/19/23   [provider]  TURMERIC PO Take by mouth daily.    [provider]      Allergies    Aleve [naproxen]    Review of Systems   Review of Systems  HENT:  Positive for sinus pressure.   Respiratory:  Negative for shortness of breath and stridor.   Cardiovascular:  Negative for chest pain, palpitations and leg swelling.  All other systems reviewed and are negative.   Physical Exam Updated Vital Signs BP (!) 133/96   Pulse (!) 142   Temp 98.2 F (36.8 C) (Oral)   Resp (!) 23   Ht 5' (1.524 m)   Wt 103.4 kg   SpO2 96%   BMI 44.53 kg/m  Physical Exam Vitals and nursing note reviewed.  Constitutional:      Appearance: Normal appearance.  HENT:     Head: Normocephalic and atraumatic.     Nose: Nose normal.     Mouth/Throat:     Mouth: Mucous membranes are moist.  Eyes:     Extraocular Movements: Extraocular movements intact.     Conjunctiva/sclera: Conjunctivae normal.     Pupils: Pupils are equal, round, and reactive to light.  Cardiovascular:     Rate and Rhythm: Normal rate and regular rhythm.     Pulses: Normal pulses.  Heart sounds: Normal heart sounds. No murmur heard.    No gallop.  Pulmonary:     Effort: Pulmonary effort is normal.     Breath sounds: Normal breath sounds.     Comments: Mild expiratory wheezing Abdominal:     General: Abdomen is flat. Bowel sounds are normal. There is no distension.     Palpations: Abdomen is soft.     Tenderness: There is no abdominal tenderness. There is no guarding.  Musculoskeletal:        General: Normal range of motion.     Cervical back: Normal range of motion and neck supple.     Right lower leg: No edema.     Left lower leg: No edema.  Skin:    General: Skin is warm and dry.  Neurological:     General: No focal deficit present.     Mental Status: She is  alert and oriented to person, place, and time. Mental status is at baseline.  Psychiatric:        Mood and Affect: Mood normal.        Behavior: Behavior normal.        Thought Content: Thought content normal.        Judgment: Judgment normal.     ED Results / Procedures / Treatments   Labs (all labs ordered are listed, but only abnormal results are displayed) Labs Reviewed  COMPREHENSIVE METABOLIC PANEL WITH GFR - Abnormal; Notable for the following components:      Result Value   Calcium  8.5 (*)    Albumin 3.3 (*)    All other components within normal limits  CBC WITH DIFFERENTIAL/PLATELET - Abnormal; Notable for the following components:   RBC 3.70 (*)    Hemoglobin 10.4 (*)    HCT 33.7 (*)    All other components within normal limits  BRAIN NATRIURETIC PEPTIDE - Abnormal; Notable for the following components:   B Natriuretic Peptide 147.0 (*)    All other components within normal limits  TSH  MAGNESIUM  TROPONIN I (HIGH SENSITIVITY)    EKG EKG Interpretation Date/Time:  Monday May 05 2023 11:25:35 EDT Ventricular Rate:  149 PR Interval:  90 QRS Duration:  110 QT Interval:  364 QTC Calculation: 574 R Axis:   -8  Text Interpretation: Sinus tachycardia or afutter w 2:1 Borderline repolarization abnormality Abnormal ECG Confirmed by Dorenda Gandy [phone]) on 05/05/2023 11:30:31 AM  Radiology No results found.  Procedures Procedures    Medications Ordered in ED Medications  diltiazem  (CARDIZEM ) 1 mg/mL load via infusion 20 mg (20 mg Intravenous Bolus from Bag 05/05/23 1158)    And  diltiazem  (CARDIZEM ) 125 mg in dextrose  5% 125 mL (1 mg/mL) infusion (5 mg/hr Intravenous Restarted 05/05/23 1348)  sodium chloride  0.9 % bolus 500 mL (500 mLs Intravenous Bolus 05/05/23 1211)    ED Course/ Medical Decision Making/ A&P                                 Medical Decision Making Amount and/or Complexity of Data Reviewed Labs: ordered. Radiology:  ordered.  Risk Prescription drug management. Decision regarding hospitalization.   This patient presents to the ED for concern of tachycardia, this involves an extensive number of treatment options, and is a complaint that carries with it a high risk of complications and morbidity.  The differential diagnosis includes atrial fibrillation, atrial flutter, SVT, thyroid storm, ACS, pulmonary embolus, pneumonia,  pneumothorax, hemothorax   Co morbidities that complicate the patient evaluation  None   Additional history obtained:  Additional history obtained from medical records External records from outside source obtained and reviewed including none   Lab Tests:  I Ordered, and personally interpreted labs.  The pertinent results include: Anemia, elevated BNP, normal troponin, normal magnesium, normal electrolytes, normal kidney function liver function   Imaging Studies ordered:  I ordered imaging studies including chest x-ray I independently visualized and interpreted imaging which showed no acute cardiopulmonary process I agree with the radiologist interpretation   Cardiac Monitoring: / EKG:  The patient was maintained on a cardiac monitor.  I personally viewed and interpreted the cardiac monitored which showed an underlying rhythm of: Atrial tachycardia, no STEMI   Consultations Obtained:  I requested consultation with the hospitalist,  and discussed lab and imaging findings as well as pertinent plan - they recommend: Admission   Problem List / ED Course / Critical interventions / Medication management  Patient does remain stable at this time.  Patient was initially placed on Cardizem  drip and did convert back to normal sinus rhythm with a rate in the 70s.  After stopping the Cardizem  drip patient did convert back into an atrial tachycardia.  She has been put back on the Cardizem  drip at this time.  She does continue to remain asymptomatic with no chest pain, shortness of  breath or palpitations.  She notes that she otherwise feels at her baseline other than sinus congestion.  Will plan for admission to the hospitalist service at this time.  Suspect atrial fibrillation versus atrial flutter.  Patient case was discussed with Dr. Mason Sole who has excepted for admission at this time.  Patient has been fully evaluated by attending physician as well who is in agreement to plan. I ordered medication including Cardizem  for A-fib RVR Reevaluation of the patient after these medicines showed that the patient improved I have reviewed the patients home medicines and have made adjustments as needed   Social Determinants of Health:  None   Test / Admission - Considered:  Admission        Final Clinical Impression(s) / ED Diagnoses Final diagnoses:  None    Rx / DC Orders ED Discharge Orders     None         Emmalene Hare 05/05/23 1438    Dorenda Gandy, MD 05/06/23 [phone]

## 2023-05-05 NOTE — ED Triage Notes (Addendum)
 Cough, congestion, sinus pressure and pain x 1 week. Taking tylenol .   Pt heart rate elevated during triage, noted to be 158-160, pt states she is not having any chest pain but "feels SOB at times with this congestion".

## 2023-05-05 NOTE — ED Triage Notes (Addendum)
 Patient BIB [facility] from [facility] urgent care called out for patient having SVT pulse 158 on 12 lead, Patient was dx with sinus infection. EMS stated she ran Afib on there monitor. Patient has hx of anxiety. Patient was seen in urgent care for sinus infection, that has been going on since Monday.

## 2023-05-05 NOTE — H&P (Signed)
 History and Physical    [name] JJO:[id number] DOB: [birth year] DOA: 05/05/2023  PCP: Orest Bio, MD   Patient coming from: Urgent care  Chief Complaint: Tachycardia  HPI: [name] is a 68 y.o. female with medical history significant for anxiety, dyslipidemia, GERD, OA, who presented to urgent care today for complaints of sinus drainage as well as nasal congestion and cough.  She was apparently taking some Tylenol  for her symptoms and in triage was noted to have a heart rate in the 150-160 bpm range.  She was having some intermittent shortness of breath that she thought was related to her chest congestion.  Due to her elevated heart rates with question of SVT versus atrial fibrillation with RVR, she was sent to the ED for further evaluation. She denies any lightheadedness, dizziness, blurred vision, or other symptomatology.  Denies history of tobacco abuse or alcohol use.   ED Course: Vital signs with elevated heart rates noted, but otherwise stable.  Hemoglobin 10.4 and BNP 147.  Chest x-ray on personal review with no acute findings.  Patient was given a bolus of Cardizem  drip and briefly converted to sinus rhythm, but then converted back to atrial fibrillation/flutter and therefore was started on IV drip.  Review of Systems: Reviewed as noted above, otherwise negative.  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   GERD (gastroesophageal reflux disease)    History of kidney stones 07/2014   Plantar fascial fibromatosis of left foot    Sinus problem     Past Surgical History:  Procedure Laterality Date   BIOPSY  09/04/2020   Procedure: BIOPSY;  Surgeon: Vinetta Greening, DO;  [facility];  Service: Endoscopy;;  descending colon polyp   CARPAL TUNNEL RELEASE Right 10/04/2014   Procedure: RIGHT CARPAL TUNNEL RELEASE;  Surgeon: Lyanne Sample, MD;  Location: [facility];   Service: Orthopedics;  Laterality: Right;  REG/FAB   CARPAL TUNNEL RELEASE Left 11/15/2014   Procedure: LEFT CARPAL TUNNEL RELEASE;  Surgeon: Lyanne Sample, MD;  Location: [facility];  Service: Orthopedics;  Laterality: Left;   CERVICAL CONIZATION W/BX  04/2014   CHOLECYSTECTOMY  11/30/2014   COLONOSCOPY WITH PROPOFOL  N/A 09/04/2020   Procedure: COLONOSCOPY WITH PROPOFOL ;  Surgeon: Vinetta Greening, DO;  Location: [facility];  Service: Endoscopy;  Laterality: N/A;  7:30am   DILATION AND CURETTAGE OF UTERUS  04/2014   DILATION AND CURETTAGE OF UTERUS N/A 08/10/2019   Procedure: DILATATION AND CURETTAGE OF THE UTERUS UNDER ULTRASOUND GUIDANCE;  Surgeon: Alphonso Aschoff, MD;  Location: [facility];  Service: Gynecology;  Laterality: N/A;   HYMENECTOMY  age 22 or age 83   hysteroscoptic insertion of mirena  IUD  08/2015   HYSTEROSCOPY  02/2014   INTRAUTERINE DEVICE (IUD) INSERTION N/A 08/10/2019   Procedure: INTRAUTERINE DEVICE (IUD) REMOVAL AND REINSERTION;  Surgeon: Alphonso Aschoff, MD;  Location: [facility];  Service: Gynecology;  Laterality: N/A;  PHARMACY MIRENA  IUD   OPERATIVE ULTRASOUND N/A 08/10/2019   Procedure: OPERATIVE ULTRASOUND;  Surgeon: Alphonso Aschoff, MD;  Location: [facility];  Service: Gynecology;  Laterality: N/A;   uterine polyps removed  06/03/2018   VARICOSE VEIN SURGERY Bilateral 15 yrs ago   tx with injections only no sx   WISDOM TOOTH EXTRACTION     as a teenager  reports that she has never smoked. She has been exposed to tobacco smoke. She has never used smokeless tobacco. She reports that she does not drink alcohol and does not use drugs.  Allergies  Allergen Reactions   Aleve [Naproxen] Other (See Comments)    GI bleeding    Family History  Problem Relation Age of Onset   Diabetes Mother    Stroke Mother    Osteoarthritis Mother    Heart disease Father    Cancer Maternal Aunt        Breast   Colon cancer  Neg Hx    Gastric cancer Neg Hx    Esophageal cancer Neg Hx     Prior to Admission medications   Medication Sig Start Date End Date Taking? Authorizing Provider  acetaminophen  (TYLENOL ) 500 MG tablet Take 1,000 mg by mouth See admin instructions. Take 1000 mg in the morning, may take an additional 1000 mg every 6 hours as needed for pain   Yes [provider]  Cholecalciferol (VITAMIN D ) 125 MCG (5000 UT) CAPS Take by mouth.   Yes [provider]  COLLAGEN PO Take by mouth.   Yes [provider]  FLUoxetine  (PROZAC ) 20 MG capsule Take by mouth. Take 40 mg every other day and 20 mg on opposite days 01/17/23  Yes [provider]  omeprazole (PRILOSEC) 20 MG capsule Take 20 mg by mouth daily.   Yes [provider]  rosuvastatin  (CRESTOR ) 5 MG tablet Take 5 mg by mouth daily. 03/19/23  Yes [provider]  TURMERIC PO Take by mouth daily.   Yes [provider]  amoxicillin -clavulanate (AUGMENTIN ) 875-125 MG tablet Take 1 tablet by mouth 2 (two) times daily for 7 days. 05/05/23 05/12/23  Leath-Warren, Belen Bowers, NP  diclofenac Sodium (VOLTAREN) 1 % GEL Apply 1 application topically 2 (two) times daily. Patient not taking: Reported on 05/05/2023    [provider]  fluticasone  (FLONASE ) 50 MCG/ACT nasal spray Place 2 sprays into both nostrils daily. 05/05/23   Hardy Lia, NP    Physical Exam: Vitals:   05/05/23 1503 05/05/23 1510 05/05/23 1512 05/05/23 1634  BP:  122/89    Pulse:   (!) 145 74  Resp: (!) 24 (!) 22 20   Temp:    97.6 F (36.4 C)  TempSrc: Oral   Oral  SpO2:   99% 99%  Weight: 110.8 kg     Height: 5' (1.524 m)       Constitutional: NAD, calm, comfortable Vitals:   05/05/23 1503 05/05/23 1510 05/05/23 1512 05/05/23 1634  BP:  122/89    Pulse:   (!) 145 74  Resp: (!) 24 (!) 22 20   Temp:    97.6 F (36.4 C)  TempSrc: Oral   Oral  SpO2:   99% 99%  Weight: 110.8 kg     Height: 5' (1.524 m)       Eyes: lids and conjunctivae normal Neck: normal, supple Respiratory: clear to auscultation bilaterally. Normal respiratory effort. No accessory muscle use.  Cardiovascular: Irregular and tachycardic Abdomen: no tenderness, no distention. Bowel sounds positive.  Musculoskeletal:  No edema. Skin: no rashes, lesions, ulcers.  Psychiatric: Flat affect  Labs on Admission: I have personally reviewed following labs and imaging studies  CBC: Recent Labs  Lab 05/05/23 1156  WBC 5.1  NEUTROABS 2.3  HGB 10.4*  HCT 33.7*  MCV 91.1  PLT 267   Basic Metabolic Panel: Recent Labs  Lab 05/05/23 1156  NA 139  K 3.9  CL 107  CO2 23  GLUCOSE 93  BUN 17  CREATININE 0.63  CALCIUM  8.5*  MG 2.0   GFR: Estimated Creatinine Clearance: 77.1 mL/min (by C-G formula based on SCr of 0.63 mg/dL). Liver Function Tests: Recent Labs  Lab 05/05/23 1156  AST 15  ALT 11  ALKPHOS 46  BILITOT 0.4  PROT 6.6  ALBUMIN 3.3*   No results for input(s): "LIPASE", "AMYLASE" in the last 168 hours. No results for input(s): "AMMONIA" in the last 168 hours. Coagulation Profile: No results for input(s): "INR", "PROTIME" in the last 168 hours. Cardiac Enzymes: No results for input(s): "CKTOTAL", "CKMB", "CKMBINDEX", "TROPONINI" in the last 168 hours. BNP (last 3 results) No results for input(s): "PROBNP" in the last 8760 hours. HbA1C: No results for input(s): "HGBA1C" in the last 72 hours. CBG: No results for input(s): "GLUCAP" in the last 168 hours. Lipid Profile: No results for input(s): "CHOL", "HDL", "LDLCALC", "TRIG", "CHOLHDL", "LDLDIRECT" in the last 72 hours. Thyroid Function Tests: Recent Labs    05/05/23 1156  TSH 1.359   Anemia Panel: No results for input(s): "VITAMINB12", "FOLATE", "FERRITIN", "TIBC", "IRON", "RETICCTPCT" in the last 72 hours. Urine analysis: No results found for: "COLORURINE", "APPEARANCEUR", "LABSPEC", "PHURINE", "GLUCOSEU", "HGBUR", "BILIRUBINUR", "KETONESUR",  "PROTEINUR", "UROBILINOGEN", "NITRITE", "LEUKOCYTESUR"  Radiological Exams on Admission: DG Chest Port 1 View Result Date: 05/05/2023 CLINICAL DATA:  Dyspnea. Supraventricular tachycardia. Sinus infection. EXAM: PORTABLE CHEST - 1 VIEW COMPARISON:  None available. FINDINGS: Cardiomediastinal silhouette and pulmonary vasculature are within normal limits. Lungs are clear. Severe bilateral glenohumeral osteoarthrosis. IMPRESSION: No acute cardiopulmonary process. Electronically Signed   By: Elester Grim M.D.   On: 05/05/2023 15:42    EKG: Independently reviewed.  Atrial tachycardia 140 bpm range.  Assessment/Plan Principal Problem:   SVT (supraventricular tachycardia) (HCC) Active Problems:   GERD (gastroesophageal reflux disease)    SVT - Cardizem  drip discontinued -Patient given adenosine  6 mg followed by 12 mg dose with conversion to sinus rhythm - TSH 1.35 - Plan to check 2D echocardiogram when heart rates are improved - Appreciate cardiology evaluation - Continue to monitor on telemetry  Acute sinusitis - Continue on Augmentin  - Continue supportive measures with nasal sprays/Tylenol  as needed  Anemia - Continue monitor CBC and check anemia panel  Dyslipidemia - Continue Crestor   Anxiety disorder - Continue home medications  GERD - PPI   DVT prophylaxis: Lovenox  Code Status: Full Family Communication: None at bedside Disposition Plan: Admit for evaluation of atrial fibrillation/flutter with RVR Consults called: Cardiology Admission status: Inpatient, stepdown  Severity of Illness: The appropriate patient status for this patient is INPATIENT. Inpatient status is judged to be reasonable and necessary in order to provide the required intensity of service to ensure the patient's safety. The patient's presenting symptoms, physical exam findings, and initial radiographic and laboratory data in the context of their chronic comorbidities is felt to place them at high risk  for further clinical deterioration. Furthermore, it is not anticipated that the patient will be medically stable for discharge from the hospital within 2 midnights of admission.   * I certify that at the point of admission it is my clinical judgment that the patient will require inpatient hospital care spanning beyond 2 midnights from the point of admission due to high intensity of service, high risk for further deterioration and high frequency of surveillance required.*   Hall Birchard D Pattricia Weiher DO [facility]  If 7PM-7AM, please contact night-coverage [email or web address]  05/05/2023, 4:51  PM

## 2023-05-05 NOTE — Discharge Instructions (Addendum)
 Take medication as directed. Increase fluids and get plenty of rest. May take over-the-counter Tylenol  as needed for pain, fever, or general discomfort. Recommend normal saline nasal spray to help with nasal congestion throughout the day. For your cough, recommend use of a humidifier in your bedroom at nighttime during sleep and sleeping elevated on pillows while cough symptoms persist. If symptoms fail to improve with this treatment, please follow-up with your primary care physician for further evaluation.  Elevated HR: I am sending you to the emergency department for further evaluation of your increased heart rate.

## 2023-05-05 NOTE — ED Notes (Signed)
 Patient is being discharged from the Urgent Care and sent to the Emergency Department via EMS . Per provider Gerlene Koh., patient is in need of higher level of care due to EKG showing SVT with elevated heart rate needing further testing and care. Patient is aware and verbalizes understanding of plan of care.  Vitals:   05/05/23 1019 05/05/23 1045  BP:  (!) 137/90  Pulse: (!) 158 (!) 155  Resp:    Temp:    SpO2:  97%

## 2023-05-05 NOTE — ED Notes (Signed)
 Called and gave report to charge nurse at [facility] ED. EMS left with pt, and is on the way there.

## 2023-05-06 DIAGNOSIS — I4719 Other supraventricular tachycardia: Principal | ICD-10-CM

## 2023-05-06 DIAGNOSIS — I471 Supraventricular tachycardia, unspecified: Secondary | ICD-10-CM | POA: Diagnosis not present

## 2023-05-06 LAB — RESP PANEL BY RT-PCR (RSV, FLU A&B, COVID)  RVPGX2
Influenza A by PCR: NEGATIVE
Influenza B by PCR: NEGATIVE
Resp Syncytial Virus by PCR: NEGATIVE
SARS Coronavirus 2 by RT PCR: NEGATIVE

## 2023-05-06 LAB — CBC
HCT: 31.8 % — ABNORMAL LOW (ref 36.0–46.0)
Hemoglobin: 10.1 g/dL — ABNORMAL LOW (ref 12.0–15.0)
MCH: 28.9 pg (ref 26.0–34.0)
MCHC: 31.8 g/dL (ref 30.0–36.0)
MCV: 90.9 fL (ref 80.0–100.0)
Platelets: 263 10*3/uL (ref 150–400)
RBC: 3.5 MIL/uL — ABNORMAL LOW (ref 3.87–5.11)
RDW: 14.7 % (ref 11.5–15.5)
WBC: 6.5 10*3/uL (ref 4.0–10.5)
nRBC: 0 % (ref 0.0–0.2)

## 2023-05-06 LAB — BASIC METABOLIC PANEL WITH GFR
Anion gap: 8 (ref 5–15)
BUN: 15 mg/dL (ref 8–23)
CO2: 22 mmol/L (ref 22–32)
Calcium: 8.6 mg/dL — ABNORMAL LOW (ref 8.9–10.3)
Chloride: 108 mmol/L (ref 98–111)
Creatinine, Ser: 0.55 mg/dL (ref 0.44–1.00)
GFR, Estimated: >60 mL/min (ref >60)
Glucose, Bld: 101 mg/dL — ABNORMAL HIGH (ref 70–99)
Potassium: 3.9 mmol/L (ref 3.5–5.1)
Sodium: 138 mmol/L (ref 135–145)

## 2023-05-06 LAB — HIV ANTIBODY (ROUTINE TESTING W REFLEX): HIV Screen 4th Generation wRfx: NONREACTIVE

## 2023-05-06 LAB — MAGNESIUM: Magnesium: 2 mg/dL (ref 1.7–2.4)

## 2023-05-06 MED ORDER — LACTATED RINGERS IV BOLUS
500.0000 mL | Freq: Once | INTRAVENOUS | Status: AC
  Administered 2023-05-06: 500 mL via INTRAVENOUS

## 2023-05-06 MED ORDER — AMIODARONE HCL IN DEXTROSE 360-4.14 MG/200ML-% IV SOLN
60.0000 mg/h | INTRAVENOUS | Status: DC
  Administered 2023-05-06: 60 mg/h via INTRAVENOUS
  Filled 2023-05-06: qty 200

## 2023-05-06 MED ORDER — DILTIAZEM HCL 30 MG PO TABS
30.0000 mg | ORAL_TABLET | Freq: Four times a day (QID) | ORAL | Status: DC
  Administered 2023-05-06 – 2023-05-07 (×4): 30 mg via ORAL
  Filled 2023-05-06 (×4): qty 1

## 2023-05-06 MED ORDER — AMIODARONE HCL IN DEXTROSE 360-4.14 MG/200ML-% IV SOLN: 30.0000 mg/h | INTRAVENOUS | Status: DC

## 2023-05-06 MED ORDER — POTASSIUM CHLORIDE CRYS ER 20 MEQ PO TBCR
40.0000 meq | EXTENDED_RELEASE_TABLET | Freq: Once | ORAL | Status: AC
  Administered 2023-05-06: 40 meq via ORAL
  Filled 2023-05-06: qty 2

## 2023-05-06 MED ORDER — MIDODRINE HCL 5 MG PO TABS
10.0000 mg | ORAL_TABLET | Freq: Once | ORAL | Status: AC
  Administered 2023-05-06: 10 mg via ORAL
  Filled 2023-05-06: qty 2

## 2023-05-06 MED ORDER — DILTIAZEM HCL 30 MG PO TABS: 30.0000 mg | ORAL_TABLET | Freq: Four times a day (QID) | ORAL | Status: DC

## 2023-05-06 MED ORDER — DILTIAZEM HCL-DEXTROSE 125-5 MG/125ML-% IV SOLN (PREMIX)
5.0000 mg/h | INTRAVENOUS | Status: DC
  Administered 2023-05-06 – 2023-05-07 (×3): 5 mg/h via INTRAVENOUS
  Filled 2023-05-06 (×2): qty 125

## 2023-05-06 NOTE — Plan of Care (Signed)

## 2023-05-06 NOTE — Progress Notes (Signed)
 PROGRESS NOTE    [name]  ZOX:[id number] DOB: [birth year] DOA: 05/05/2023 PCP: Orest Bio, MD   Brief Narrative:    [name] is a 68 y.o. female with medical history significant for anxiety, dyslipidemia, GERD, OA, who presented to urgent care today for complaints of sinus drainage as well as nasal congestion and cough.  She was apparently taking some Tylenol  for her symptoms and in triage was noted to have a heart rate in the 150-160 bpm range.  She was brought to the ED for further evaluation and was initially thought to have atrial flutter/fibrillation, but was subsequently noted to have SVT.  She received multiple doses of adenosine  on 4/21 with transient conversion to SR and remained on IV Cardizem  drip overnight.  Cardiology evaluated on 4/22 with noted atrial tachycardia and plans to wean off of Cardizem  drip.  Assessment & Plan:   Principal Problem:   SVT (supraventricular tachycardia) (HCC) Active Problems:   GERD (gastroesophageal reflux disease)  Assessment and Plan:  Atrial tachycardia - Cardizem  drip to be weaned and transition to oral Cardizem  - TSH 1.35 - Plan to check 2D echocardiogram when heart rates are improved - Appreciate cardiology evaluation - Continue to monitor on telemetry   Acute sinusitis - Continue on Augmentin  - Continue supportive measures with nasal sprays/Tylenol  as needed   Anemia - Continue monitor CBC and check anemia panel   Dyslipidemia - Continue Crestor    Anxiety disorder - Continue home medications   GERD - PPI  Morbid obesity - BMI 47.7    DVT prophylaxis: Lovenox  Code Status: Full Family Communication: None at bedside Disposition Plan:  Status is: Inpatient Remains inpatient appropriate because: Need for IV medications.  Consultants:  Cardiology  Procedures:  None  Antimicrobials:  None   Subjective: Patient seen and evaluated today with no new acute complaints or concerns.  She required multiple  doses of adenosine  to try and correct SVT yesterday only to have elevated heart rates once again.  She has remained on Cardizem  drip overnight with improved heart rate control this morning.  Objective: Vitals:   05/06/23 0728 05/06/23 0800 05/06/23 0900 05/06/23 0930  BP: (!) 111/59  (!) 109/49 (!) 109/49  Pulse:  68 68   Resp:  16 (!) 22   Temp: 98.5 F (36.9 C)     TempSrc: Oral     SpO2: 98% 99% 97%   Weight:      Height:        Intake/Output Summary (Last 24 hours) at 05/06/2023 1023 Last data filed at 05/06/2023 0800 Gross per 24 hour  Intake 786.36 ml  Output 250 ml  Net 536.36 ml   Filed Weights   05/05/23 1122 05/05/23 1503  Weight: 103.4 kg 110.8 kg    Examination:  General exam: Appears calm and comfortable  Respiratory system: Clear to auscultation. Respiratory effort normal. Cardiovascular system: S1 & S2 heard, RRR.  Gastrointestinal system: Abdomen is soft Central nervous system: Alert and awake Extremities: No edema Skin: No significant lesions noted Psychiatry: Flat affect.    Data Reviewed: I have personally reviewed following labs and imaging studies  CBC: Recent Labs  Lab 05/05/23 1156 05/06/23 0435  WBC 5.1 6.5  NEUTROABS 2.3  --   HGB 10.4* 10.1*  HCT 33.7* 31.8*  MCV 91.1 90.9  PLT 267 263   Basic Metabolic Panel: Recent Labs  Lab 05/05/23 1156 05/06/23 0435  NA 139 138  K 3.9 3.9  CL 107 108  CO2 23 22  GLUCOSE 93 101*  BUN 17 15  CREATININE 0.63 0.55  CALCIUM  8.5* 8.6*  MG 2.0 2.0   GFR: Estimated Creatinine Clearance: 77.1 mL/min (by C-G formula based on SCr of 0.55 mg/dL). Liver Function Tests: Recent Labs  Lab 05/05/23 1156  AST 15  ALT 11  ALKPHOS 46  BILITOT 0.4  PROT 6.6  ALBUMIN 3.3*   No results for input(s): "LIPASE", "AMYLASE" in the last 168 hours. No results for input(s): "AMMONIA" in the last 168 hours. Coagulation Profile: No results for input(s): "INR", "PROTIME" in the last 168 hours. Cardiac  Enzymes: No results for input(s): "CKTOTAL", "CKMB", "CKMBINDEX", "TROPONINI" in the last 168 hours. BNP (last 3 results) No results for input(s): "PROBNP" in the last 8760 hours. HbA1C: No results for input(s): "HGBA1C" in the last 72 hours. CBG: No results for input(s): "GLUCAP" in the last 168 hours. Lipid Profile: No results for input(s): "CHOL", "HDL", "LDLCALC", "TRIG", "CHOLHDL", "LDLDIRECT" in the last 72 hours. Thyroid Function Tests: Recent Labs    05/05/23 1156  TSH 1.359   Anemia Panel: No results for input(s): "VITAMINB12", "FOLATE", "FERRITIN", "TIBC", "IRON", "RETICCTPCT" in the last 72 hours. Sepsis Labs: No results for input(s): "PROCALCITON", "LATICACIDVEN" in the last 168 hours.  Recent Results (from the past 240 hours)  MRSA Next Gen by PCR, Nasal     Status: None   Collection Time: 05/05/23  2:55 PM   Specimen: Nasal Mucosa; Nasal Swab  Result Value Ref Range Status   MRSA by PCR Next Gen NOT DETECTED NOT DETECTED Final    Comment: (NOTE) The GeneXpert MRSA Assay (FDA approved for NASAL specimens only), is one component of a comprehensive MRSA colonization surveillance program. It is not intended to diagnose MRSA infection nor to guide or monitor treatment for MRSA infections. Test performance is not FDA approved in patients less than 33 years old. Performed at [facility], [address], [place], [place] [address]          Radiology Studies: [facility] 1 View Result Date: 05/05/2023 CLINICAL DATA:  Dyspnea. Supraventricular tachycardia. Sinus infection. EXAM: PORTABLE CHEST - 1 VIEW COMPARISON:  None available. FINDINGS: Cardiomediastinal silhouette and pulmonary vasculature are within normal limits. Lungs are clear. Severe bilateral glenohumeral osteoarthrosis. IMPRESSION: No acute cardiopulmonary process. Electronically Signed   By: Elester Grim M.D.   On: 05/05/2023 15:42        Scheduled Meds:  amoxicillin -clavulanate  1 tablet Oral BID    Chlorhexidine  Gluconate Cloth  6 each Topical Q0600   diltiazem   30 mg Oral Q6H   enoxaparin  (LOVENOX ) injection  55 mg Subcutaneous Q24H   FLUoxetine   20 mg Oral QODAY   [START ON 05/07/2023] FLUoxetine   40 mg Oral QODAY   pantoprazole   40 mg Oral Daily   rosuvastatin   5 mg Oral Daily   Continuous Infusions:  diltiazem  (CARDIZEM ) infusion 5 mg/hr (05/06/23 0512)     LOS: 1 day    Time spent: 55 minutes    Nyko Gell D Mason Sole, DO [facility]  If 7PM-7AM, please contact night-coverage [email or web address] 05/06/2023, 10:23 AM

## 2023-05-06 NOTE — Plan of Care (Signed)

## 2023-05-06 NOTE — Consult Note (Signed)
 Cardiology Consultation   Patient ID: [name] MRN: [id number]; DOB: [birth year]  Admit date: 05/05/2023 Date of Consult: 05/06/2023  PCP:  Orest Bio, MD   [facility] Providers Cardiologist:  New, requests Dr Londa Rival who her mother sees    Patient Profile:   [name] is a 68 y.o. female with a hx of HLD, gerd, anxiety  who is being seen 05/06/2023 for the evaluation of tachycardia  at the request of Dr Mason Sole.  History of Present Illness:   [name] 68 yo female history of HLD, GERD, anxiety who presented with tachycardia 150s to 160s. She reports 1 week of productice cough, sinus congestion. Presented to urgent care and found to be tachycardic in the 150s, patient was sent to ER. Patient denies any significant palpitations.  Received IV adenosine  6mg  x3 total doses, 12mg  x 1. Started on dilt gtt.   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   GERD (gastroesophageal reflux disease)    History of kidney stones 07/2014   Plantar fascial fibromatosis of left foot    Sinus problem     Past Surgical History:  Procedure Laterality Date   BIOPSY  09/04/2020   Procedure: BIOPSY;  Surgeon: Vinetta Greening, DO;  [facility];  Service: Endoscopy;;  descending colon polyp   CARPAL TUNNEL RELEASE Right 10/04/2014   Procedure: RIGHT CARPAL TUNNEL RELEASE;  Surgeon: Lyanne Sample, MD;  Location: [facility];  Service: Orthopedics;  Laterality: Right;  REG/FAB   CARPAL TUNNEL RELEASE Left 11/15/2014   Procedure: LEFT CARPAL TUNNEL RELEASE;  Surgeon: Lyanne Sample, MD;  Location: [facility];  Service: Orthopedics;  Laterality: Left;   CERVICAL CONIZATION W/BX  04/2014    CHOLECYSTECTOMY  11/30/2014   COLONOSCOPY WITH PROPOFOL  N/A 09/04/2020   Procedure: COLONOSCOPY WITH PROPOFOL ;  Surgeon: Vinetta Greening, DO;  [facility];  Service: Endoscopy;  Laterality: N/A;  7:30am   DILATION AND CURETTAGE OF UTERUS  04/2014   DILATION AND CURETTAGE OF UTERUS N/A 08/10/2019   Procedure: DILATATION AND CURETTAGE OF THE UTERUS UNDER ULTRASOUND GUIDANCE;  Surgeon: Alphonso Aschoff, MD;  Location: [facility];  Service: Gynecology;  Laterality: N/A;   HYMENECTOMY  age 58 or age 55   hysteroscoptic insertion of mirena  IUD  08/2015   HYSTEROSCOPY  02/2014   INTRAUTERINE DEVICE (IUD) INSERTION N/A 08/10/2019   Procedure: INTRAUTERINE DEVICE (IUD) REMOVAL AND REINSERTION;  Surgeon: Alphonso Aschoff, MD;  Location: [facility];  Service: Gynecology;  Laterality: N/A;  PHARMACY MIRENA  IUD   OPERATIVE ULTRASOUND N/A 08/10/2019   Procedure: OPERATIVE ULTRASOUND;  Surgeon: Alphonso Aschoff, MD;  Location: [facility];  Service: Gynecology;  Laterality: N/A;   uterine polyps removed  06/03/2018   VARICOSE VEIN SURGERY Bilateral 15 yrs ago   tx with injections only no sx   WISDOM TOOTH EXTRACTION     as a teenager      Inpatient Medications: Scheduled Meds:  amoxicillin -clavulanate  1 tablet Oral BID   Chlorhexidine  Gluconate Cloth  6 each Topical Q0600   enoxaparin  (LOVENOX )  injection  55 mg Subcutaneous Q24H   FLUoxetine   20 mg Oral QODAY   [START ON 05/07/2023] FLUoxetine   40 mg Oral QODAY   pantoprazole   40 mg Oral Daily   rosuvastatin   5 mg Oral Daily   Continuous Infusions:  diltiazem  (CARDIZEM ) infusion 5 mg/hr (05/06/23 0512)   PRN Meds: acetaminophen  **OR** acetaminophen , guaiFENesin -dextromethorphan , metoprolol  tartrate, ondansetron  **OR** ondansetron  (ZOFRAN ) IV  Allergies:    Allergies  Allergen Reactions   Aleve [Naproxen] Other (See Comments)    GI bleeding    Social History:   Social History   Socioeconomic  History   Marital status: Widowed    Spouse name: Not on file   Number of children: Not on file   Years of education: Not on file   Highest education level: Not on file  Occupational History   Not on file  Tobacco Use   Smoking status: Never    Passive exposure: Past   Smokeless tobacco: Never  Vaping Use   Vaping status: Never Used  Substance and Sexual Activity   Alcohol use: No   Drug use: No   Sexual activity: Not Currently  Other Topics Concern   Not on file  Social History Narrative   Not on file   Social Drivers of Health   Financial Resource Strain: Not on file  Food Insecurity: No Food Insecurity (05/05/2023)   Hunger Vital Sign    Worried About Running Out of Food in the Last Year: Never true    Ran Out of Food in the Last Year: Never true  Transportation Needs: No Transportation Needs (05/05/2023)   PRAPARE - Administrator, Civil Service (Medical): No    Lack of Transportation (Non-Medical): No  Physical Activity: Not on file  Stress: Not on file  Social Connections: Moderately Integrated (05/05/2023)   Social Connection and Isolation Panel [NHANES]    Frequency of Communication with Friends and Family: More than three times a week    Frequency of Social Gatherings with Friends and Family: More than three times a week    Attends Religious Services: More than 4 times per year    Active Member of [employer] or Organizations: Yes    Attends Banker Meetings: More than 4 times per year    Marital Status: Widowed  Intimate Partner Violence: Not At Risk (05/05/2023)   Humiliation, Afraid, Rape, and Kick questionnaire    Fear of Current or Ex-Partner: No    Emotionally Abused: No    Physically Abused: No    Sexually Abused: No    Family History:    Family History  Problem Relation Age of Onset   Diabetes Mother    Stroke Mother    Osteoarthritis Mother    Heart disease Father    Cancer Maternal Aunt        Breast   Colon cancer Neg Hx     Gastric cancer Neg Hx    Esophageal cancer Neg Hx      ROS:  Please see the history of present illness.   All other ROS reviewed and negative.     Physical Exam/Data:   Vitals:   05/06/23 0600 05/06/23 0700 05/06/23 0728 05/06/23 0800  BP: (!) 108/53  (!) 111/59   Pulse: 65     Resp: (!) 21   16  Temp:  98.5 F (36.9 C) 98.5 F (36.9 C)   TempSrc:   Oral   SpO2: 96%  98%   Weight:  Height:        Intake/Output Summary (Last 24 hours) at 05/06/2023 0849 Last data filed at 05/06/2023 0402 Gross per 24 hour  Intake 546.36 ml  Output 250 ml  Net 296.36 ml      05/05/2023    3:03 PM 05/05/2023   11:22 AM 09/10/2022    9:51 AM  Last 3 Weights  Weight (lbs) 244 lb 4.3 oz 228 lb 237 lb  Weight (kg) 110.8 kg 103.42 kg 107.502 kg     Body mass index is 47.71 kg/m.  General:  Well nourished, well developed, in no acute distress HEENT: normal Neck: no JVD Vascular: No carotid bruits; Distal pulses 2+ bilaterally Cardiac:  normal S1, S2; RRR; no murmur  Lungs:  clear to auscultation bilaterally, no wheezing, rhonchi or rales  Abd: soft, nontender, no hepatomegaly  Ext: no edema Musculoskeletal:  No deformities, BUE and BLE strength normal and equal Skin: warm and dry  Neuro:  CNs 2-12 intact, no focal abnormalities noted Psych:  Normal affect     Laboratory Data:  High Sensitivity Troponin:   Recent Labs  Lab 05/05/23 1156  TROPONINIHS 12     Chemistry Recent Labs  Lab 05/05/23 1156 05/06/23 0435  NA 139 138  K 3.9 3.9  CL 107 108  CO2 23 22  GLUCOSE 93 101*  BUN 17 15  CREATININE 0.63 0.55  CALCIUM  8.5* 8.6*  MG 2.0 2.0  GFRNONAA >60 >60  ANIONGAP 9 8    Recent Labs  Lab 05/05/23 1156  PROT 6.6  ALBUMIN 3.3*  AST 15  ALT 11  ALKPHOS 46  BILITOT 0.4   Lipids No results for input(s): "CHOL", "TRIG", "HDL", "LABVLDL", "LDLCALC", "CHOLHDL" in the last 168 hours.  Hematology Recent Labs  Lab 05/05/23 1156 05/06/23 0435  WBC 5.1 6.5   RBC 3.70* 3.50*  HGB 10.4* 10.1*  HCT 33.7* 31.8*  MCV 91.1 90.9  MCH 28.1 28.9  MCHC 30.9 31.8  RDW 14.6 14.7  PLT 267 263   Thyroid  Recent Labs  Lab 05/05/23 1156  TSH 1.359    BNP Recent Labs  Lab 05/05/23 1156  BNP 147.0*    DDimer No results for input(s): "DDIMER" in the last 168 hours.   Radiology/Studies:  DG Chest Port 1 View Result Date: 05/05/2023 CLINICAL DATA:  Dyspnea. Supraventricular tachycardia. Sinus infection. EXAM: PORTABLE CHEST - 1 VIEW COMPARISON:  None available. FINDINGS: Cardiomediastinal silhouette and pulmonary vasculature are within normal limits. Lungs are clear. Severe bilateral glenohumeral osteoarthrosis. IMPRESSION: No acute cardiopulmonary process. Electronically Signed   By: Elester Grim M.D.   On: 05/05/2023 15:42     Assessment and Plan:   1.Atrial tachycardia - from review of EKGs and telemetry findings most consistent with atrial tachycardia, has been paroxysmal during admission - recent URI symptoms, unclear if may have exacerbated - 4/21 1540 EKG most clearly demonstrates atach as the rhythm  - currently on dilt gtt at 5, transiently on amio gtt.   - start oral dilt 30mg  every 6 hours with hold parameters, wean dilt gtt - f/u echo - no indication for anticoagulation in the setting of atach. I have not seen any aflutter or afib - monitor additional 24 hours or oral regimen      For questions or updates, please contact [facility] Please consult [email or web address] for contact info under    Signed, Armida Lander, MD  05/06/2023 8:49 AM

## 2023-05-07 ENCOUNTER — Inpatient Hospital Stay (HOSPITAL_COMMUNITY)

## 2023-05-07 ENCOUNTER — Inpatient Hospital Stay

## 2023-05-07 ENCOUNTER — Telehealth: Payer: Self-pay

## 2023-05-07 DIAGNOSIS — R9431 Abnormal electrocardiogram [ECG] [EKG]: Secondary | ICD-10-CM

## 2023-05-07 DIAGNOSIS — R002 Palpitations: Secondary | ICD-10-CM

## 2023-05-07 DIAGNOSIS — I471 Supraventricular tachycardia, unspecified: Secondary | ICD-10-CM | POA: Diagnosis not present

## 2023-05-07 DIAGNOSIS — R000 Tachycardia, unspecified: Secondary | ICD-10-CM

## 2023-05-07 LAB — BASIC METABOLIC PANEL WITH GFR
Anion gap: 6 (ref 5–15)
BUN: 16 mg/dL (ref 8–23)
CO2: 25 mmol/L (ref 22–32)
Calcium: 8.8 mg/dL — ABNORMAL LOW (ref 8.9–10.3)
Chloride: 107 mmol/L (ref 98–111)
Creatinine, Ser: 0.7 mg/dL (ref 0.44–1.00)
GFR, Estimated: >60 mL/min (ref >60)
Glucose, Bld: 96 mg/dL (ref 70–99)
Potassium: 4.5 mmol/L (ref 3.5–5.1)
Sodium: 138 mmol/L (ref 135–145)

## 2023-05-07 LAB — ECHOCARDIOGRAM COMPLETE
AR max vel: 2.73 cm2
AV Area VTI: 2.61 cm2
AV Area mean vel: 2.55 cm2
AV Mean grad: 3 mmHg
AV Peak grad: 6.1 mmHg
Ao pk vel: 1.23 m/s
Area-P 1/2: 4.15 cm2
Calc EF: 46.6 %
Est EF: 50
Height: 60 in
S' Lateral: 3.6 cm
Single Plane A2C EF: 48 %
Single Plane A4C EF: 39.5 %
Weight: 3908.31 [oz_av]

## 2023-05-07 LAB — CBC
HCT: 33.4 % — ABNORMAL LOW (ref 36.0–46.0)
Hemoglobin: 10.4 g/dL — ABNORMAL LOW (ref 12.0–15.0)
MCH: 28.3 pg (ref 26.0–34.0)
MCHC: 31.1 g/dL (ref 30.0–36.0)
MCV: 91 fL (ref 80.0–100.0)
Platelets: 280 10*3/uL (ref 150–400)
RBC: 3.67 MIL/uL — ABNORMAL LOW (ref 3.87–5.11)
RDW: 14.8 % (ref 11.5–15.5)
WBC: 7.4 10*3/uL (ref 4.0–10.5)
nRBC: 0 % (ref 0.0–0.2)

## 2023-05-07 LAB — MAGNESIUM: Magnesium: 2 mg/dL (ref 1.7–2.4)

## 2023-05-07 MED ORDER — DILTIAZEM HCL ER COATED BEADS 120 MG PO CP24: 120.0000 mg | ORAL_CAPSULE | Freq: Every day | ORAL | Status: DC

## 2023-05-07 MED ORDER — GUAIFENESIN-DM 100-10 MG/5ML PO SYRP: 10.0000 mL | ORAL_SOLUTION | ORAL | 0 refills | Status: AC | PRN

## 2023-05-07 MED ORDER — DILTIAZEM HCL ER COATED BEADS 120 MG PO CP24
120.0000 mg | ORAL_CAPSULE | Freq: Every day | ORAL | Status: DC
  Administered 2023-05-07: 120 mg via ORAL
  Filled 2023-05-07: qty 1

## 2023-05-07 MED ORDER — AMOXICILLIN-POT CLAVULANATE 875-125 MG PO TABS: 1.0000 | ORAL_TABLET | Freq: Two times a day (BID) | ORAL | 0 refills | Status: AC

## 2023-05-07 MED ORDER — DILTIAZEM HCL ER COATED BEADS 120 MG PO CP24: 120.0000 mg | ORAL_CAPSULE | Freq: Every day | ORAL | 0 refills | Status: DC

## 2023-05-07 NOTE — Discharge Summary (Signed)
 Physician Discharge Summary   Patient: [name] MRN: [id number] DOB: [birth year]  Admit date:     05/05/2023  Discharge date: 05/07/2023  Discharge Physician: Wynetta Heckle   PCP: Orest Bio, MD   Recommendations at discharge:   Follow up with PCP in 1-2 weeks, treating sinus infection with augmentin .  Follow up with cardiology after discharge, planning outpatient cardiac monitoring, started diltiazem    Discharge Diagnoses: Principal Problem:   SVT (supraventricular tachycardia) (HCC) Active Problems:   GERD (gastroesophageal reflux disease)  Atach - new diagnosis this admission - asymptomatic, initially presented to urgent care with cough and congestion, found to be tachycardic - was on dilt gtt transitioned to oral day prior to discharge with stability, diltiazem  30mg  every 6 hrs > consolidate per cardiology, will arrange cardiac monitoring and follow up. No anticoagulation as this does not appear to be atrial flutter or fibrillation.  Acute sinusitis - Continue on Augmentin  - Continue supportive measures with nasal sprays/Tylenol  as needed   Anemia - Continue monitor CBC at follow up   Dyslipidemia - Continue Crestor    Anxiety disorder - Continue home medications   GERD - PPI   Morbid obesity - BMI 47.7  Hospital Course: [name] is a 68 y.o. female with medical history significant for anxiety, dyslipidemia, GERD, OA, who presented to urgent care today for complaints of sinus drainage as well as nasal congestion and cough.  She was apparently taking some Tylenol  for her symptoms and in triage was noted to have a heart rate in the 150-160 bpm range.  She was brought to the ED for further evaluation and was initially thought to have atrial flutter/fibrillation, but was subsequently noted to have SVT.  She received multiple doses of adenosine  on 4/21 with transient conversion to SR and remained on IV Cardizem  drip overnight.  Cardiology evaluated on 4/22 with noted  atrial tachycardia and plans to wean off of Cardizem  drip.   Consultants: Cardiology Procedures performed: Echo  Disposition: Home Diet recommendation:  Cardiac diet DISCHARGE MEDICATION: Allergies as of 05/07/2023       Reactions   Aleve [naproxen] Other (See Comments)   GI bleeding        Medication List     TAKE these medications    acetaminophen  500 MG tablet Commonly known as: TYLENOL  Take 1,000 mg by mouth See admin instructions. Take 1000 mg in the morning, may take an additional 1000 mg every 6 hours as needed for pain   amoxicillin -clavulanate 875-125 MG tablet Commonly known as: AUGMENTIN  Take 1 tablet by mouth 2 (two) times daily for 7 days. take 5 more days (took 2 days in hospital) What changed: additional instructions   COLLAGEN PO Take by mouth.   diltiazem  120 MG 24 hr capsule Commonly known as: CARDIZEM  CD Take 1 capsule (120 mg total) by mouth daily. Start taking on: May 08, 2023   FLUoxetine  20 MG capsule Commonly known as: PROZAC  Take by mouth. Take 40 mg every other day and 20 mg on opposite days   fluticasone  50 MCG/ACT nasal spray Commonly known as: FLONASE  Place 2 sprays into both nostrils daily.   guaiFENesin -dextromethorphan  100-10 MG/5ML syrup Commonly known as: ROBITUSSIN DM Take 10 mLs by mouth every 4 (four) hours as needed for cough.   omeprazole 20 MG capsule Commonly known as: PRILOSEC Take 20 mg by mouth daily.   rosuvastatin  5 MG tablet Commonly known as: CRESTOR  Take 5 mg by mouth daily.   TURMERIC PO  Take by mouth daily.   Vitamin D  125 MCG (5000 UT) Caps Take by mouth.        Follow-up Information     Sasser, Ky Phillips, MD Follow up.   Specialty: Family Medicine Contact information: [address] [place] [address] [phone]         Laurann Pollock, MD Follow up.   Specialty: Cardiology Contact information: [address] [place] [address] [phone]                Discharge  Exam: [name] Weights   05/05/23 1122 05/05/23 1503  Weight: 103.4 kg 110.8 kg  Well-appearing female in no distress Irreg irreg, no pitting edema Clear, nonlabored  Condition at discharge: stable  The results of significant diagnostics from this hospitalization (including imaging, microbiology, ancillary and laboratory) are listed below for reference.   Imaging Studies: ECHOCARDIOGRAM COMPLETE Result Date: 05/07/2023    ECHOCARDIOGRAM REPORT   Patient Name:   [name] Date of Exam: 05/07/2023 Medical Rec #:  [id number]    Height:       60.0 in Accession #:    [id number]   Weight:       244.3 lb Date of Birth:  31-Jul-1955    BSA:          2.032 m Patient Age:    67 years     BP:           111/57 mmHg Patient Gender: F            HR:           74 bpm. Exam Location:  [facility] Procedure: 2D Echo, Color Doppler and Cardiac Doppler (Both Spectral and Color            Flow Doppler were utilized during procedure). Indications:    R94.31 Abnormal EKG  History:        Patient has no prior history of Echocardiogram examinations.  Sonographer:    Andrena Bang Referring Phys: [id number] PRATIK D [facility] IMPRESSIONS  1. Left ventricular ejection fraction, by estimation, is 50%. The left ventricle has low normal function. Left ventricular endocardial border not optimally defined to evaluate regional wall motion. There is mild left ventricular hypertrophy. Left ventricular diastolic parameters were normal.  2. Right ventricular systolic function is normal. The right ventricular size is normal. Tricuspid regurgitation signal is inadequate for assessing PA pressure.  3. Left atrial size was moderately dilated.  4. The mitral valve is abnormal. Moderate mitral valve regurgitation. No evidence of mitral stenosis.  5. The tricuspid valve is abnormal.  6. The aortic valve was not well visualized. Aortic valve regurgitation is not visualized. No aortic stenosis is present.  7. The inferior vena cava is dilated in size with >50%  respiratory variability, suggesting right atrial pressure of 8 mmHg. FINDINGS  Left Ventricle: Left ventricular ejection fraction, by estimation, is 50%. The left ventricle has low normal function. Left ventricular endocardial border not optimally defined to evaluate regional wall motion. The left ventricular internal cavity size was normal in size. There is mild left ventricular hypertrophy. Left ventricular diastolic parameters were normal. Right Ventricle: The right ventricular size is normal. Right vetricular wall thickness was not well visualized. Right ventricular systolic function is normal. Tricuspid regurgitation signal is inadequate for assessing PA pressure. Left Atrium: Left atrial size was moderately dilated. Right Atrium: Right atrial size was not well visualized. Pericardium: There is no evidence of pericardial effusion. Mitral Valve:  The mitral valve is abnormal. Moderate mitral valve regurgitation. No evidence of mitral valve stenosis. Tricuspid Valve: The tricuspid valve is abnormal. Tricuspid valve regurgitation is mild . No evidence of tricuspid stenosis. Aortic Valve: The aortic valve was not well visualized. Aortic valve regurgitation is not visualized. No aortic stenosis is present. Aortic valve mean gradient measures 3.0 mmHg. Aortic valve peak gradient measures 6.1 mmHg. Aortic valve area, by VTI measures 2.61 cm. Pulmonic Valve: The pulmonic valve was not well visualized. Pulmonic valve regurgitation is not visualized. No evidence of pulmonic stenosis. Aorta: The aortic root is normal in size and structure. Venous: The inferior vena cava is dilated in size with greater than 50% respiratory variability, suggesting right atrial pressure of 8 mmHg. IAS/Shunts: No atrial level shunt detected by color flow Doppler.  LEFT VENTRICLE PLAX 2D LVIDd:         4.80 cm      Diastology LVIDs:         3.60 cm      LV e' medial:    8.38 cm/s LV PW:         1.20 cm      LV E/e' medial:  12.6 LV IVS:         1.20 cm      LV e' lateral:   13.40 cm/s LVOT diam:     2.00 cm      LV E/e' lateral: 7.9 LV SV:         63 LV SV Index:   31 LVOT Area:     3.14 cm  LV Volumes (MOD) LV vol d, MOD A2C: 157.0 ml LV vol d, MOD A4C: 123.0 ml LV vol s, MOD A2C: 81.7 ml LV vol s, MOD A4C: 74.4 ml LV SV MOD A2C:     75.3 ml LV SV MOD A4C:     123.0 ml LV SV MOD BP:      68.7 ml RIGHT VENTRICLE RV S prime:     16.90 cm/s TAPSE (M-mode): 2.3 cm LEFT ATRIUM              Index LA diam:        4.40 cm  2.17 cm/m LA Vol (A2C):   111.0 ml 54.62 ml/m LA Vol (A4C):   69.7 ml  34.30 ml/m LA Biplane Vol: 91.2 ml  44.88 ml/m  AORTIC VALVE AV Area (Vmax):    2.73 cm AV Area (Vmean):   2.55 cm AV Area (VTI):     2.61 cm AV Vmax:           123.00 cm/s AV Vmean:          86.000 cm/s AV VTI:            0.242 m AV Peak Grad:      6.1 mmHg AV Mean Grad:      3.0 mmHg LVOT Vmax:         107.00 cm/s LVOT Vmean:        69.900 cm/s LVOT VTI:          0.201 m LVOT/AV VTI ratio: 0.83  AORTA Ao Root diam: 2.60 cm MITRAL VALVE MV Area (PHT): 4.15 cm     SHUNTS MV Decel Time: 183 msec     Systemic VTI:  0.20 m MV E velocity: 106.00 cm/s  Systemic Diam: 2.00 cm MV A velocity: 47.40 cm/s MV E/A ratio:  2.24 Armida Lander MD Electronically signed by Armida Lander MD Signature Date/Time: 05/07/2023/9:58:13  AM    Final    DG Chest Port 1 View Result Date: 05/05/2023 CLINICAL DATA:  Dyspnea. Supraventricular tachycardia. Sinus infection. EXAM: PORTABLE CHEST - 1 VIEW COMPARISON:  None available. FINDINGS: Cardiomediastinal silhouette and pulmonary vasculature are within normal limits. Lungs are clear. Severe bilateral glenohumeral osteoarthrosis. IMPRESSION: No acute cardiopulmonary process. Electronically Signed   By: Elester Grim M.D.   On: 05/05/2023 15:42    Microbiology: Results for orders placed or performed during the hospital encounter of 05/05/23  MRSA Next Gen by PCR, Nasal     Status: None   Collection Time: 05/05/23  2:55 PM   Specimen:  Nasal Mucosa; Nasal Swab  Result Value Ref Range Status   MRSA by PCR Next Gen NOT DETECTED NOT DETECTED Final    Comment: (NOTE) The GeneXpert MRSA Assay (FDA approved for NASAL specimens only), is one component of a comprehensive MRSA colonization surveillance program. It is not intended to diagnose MRSA infection nor to guide or monitor treatment for MRSA infections. Test performance is not FDA approved in patients less than 66 years old. Performed at [facility], [address] [place], [place] [address]   Resp panel by RT-PCR (RSV, Flu A&B, Covid) Anterior Nasal Swab     Status: None   Collection Time: 05/06/23  9:20 AM   Specimen: Anterior Nasal Swab  Result Value Ref Range Status   SARS Coronavirus 2 by RT PCR NEGATIVE NEGATIVE Final    Comment: (NOTE) SARS-CoV-2 target nucleic acids are NOT DETECTED.  The SARS-CoV-2 RNA is generally detectable in upper respiratory specimens during the acute phase of infection. The lowest concentration of SARS-CoV-2 viral copies this assay can detect is 138 copies/mL. A negative result does not preclude SARS-Cov-2 infection and should not be used as the sole basis for treatment or other patient management decisions. A negative result may occur with  improper specimen collection/handling, submission of specimen other than nasopharyngeal swab, presence of viral mutation(s) within the areas targeted by this assay, and inadequate number of viral copies(<138 copies/mL). A negative result must be combined with clinical observations, patient history, and epidemiological information. The expected result is Negative.  Fact Sheet for Patients:  BloggerCourse.com  Fact Sheet for Healthcare Providers:  SeriousBroker.it  This test is no t yet approved or cleared by the [place]  FDA and  has been authorized for detection and/or diagnosis of SARS-CoV-2 by FDA under an Emergency Use Authorization  (EUA). This EUA will remain  in effect (meaning this test can be used) for the duration of the COVID-19 declaration under Section 564(b)(1) of the Act, 21 U.S.C.section 360bbb-3(b)(1), unless the authorization is terminated  or revoked sooner.       Influenza A by PCR NEGATIVE NEGATIVE Final   Influenza B by PCR NEGATIVE NEGATIVE Final    Comment: (NOTE) The Xpert Xpress SARS-CoV-2/FLU/RSV plus assay is intended as an aid in the diagnosis of influenza from Nasopharyngeal swab specimens and should not be used as a sole basis for treatment. Nasal washings and aspirates are unacceptable for Xpert Xpress SARS-CoV-2/FLU/RSV testing.  Fact Sheet for Patients: BloggerCourse.com  Fact Sheet for Healthcare Providers: SeriousBroker.it  This test is not yet approved or cleared by the [place]  FDA and has been authorized for detection and/or diagnosis of SARS-CoV-2 by FDA under an Emergency Use Authorization (EUA). This EUA will remain in effect (meaning this test can be used) for the duration of the COVID-19 declaration under Section 564(b)(1) of the Act, 21 U.S.C. section  360bbb-3(b)(1), unless the authorization is terminated or revoked.     Resp Syncytial Virus by PCR NEGATIVE NEGATIVE Final    Comment: (NOTE) Fact Sheet for Patients: BloggerCourse.com  Fact Sheet for Healthcare Providers: SeriousBroker.it  This test is not yet approved or cleared by the [place]  FDA and has been authorized for detection and/or diagnosis of SARS-CoV-2 by FDA under an Emergency Use Authorization (EUA). This EUA will remain in effect (meaning this test can be used) for the duration of the COVID-19 declaration under Section 564(b)(1) of the Act, 21 U.S.C. section 360bbb-3(b)(1), unless the authorization is terminated or revoked.  Performed at [facility], [address] [place], [place]  [address]     Labs: CBC: Recent Labs  Lab 05/05/23 1156 05/06/23 0435 05/07/23 0447  WBC 5.1 6.5 7.4  NEUTROABS 2.3  --   --   HGB 10.4* 10.1* 10.4*  HCT 33.7* 31.8* 33.4*  MCV 91.1 90.9 91.0  PLT 267 263 280   Basic Metabolic Panel: Recent Labs  Lab 05/05/23 1156 05/06/23 0435 05/07/23 0447  NA 139 138 138  K 3.9 3.9 4.5  CL 107 108 107  CO2 23 22 25   GLUCOSE 93 101* 96  BUN 17 15 16   CREATININE 0.63 0.55 0.70  CALCIUM  8.5* 8.6* 8.8*  MG 2.0 2.0 2.0   Liver Function Tests: Recent Labs  Lab 05/05/23 1156  AST 15  ALT 11  ALKPHOS 46  BILITOT 0.4  PROT 6.6  ALBUMIN 3.3*   CBG: No results for input(s): "GLUCAP" in the last 168 hours.  Discharge time spent: greater than 30 minutes.  Signed: Wynetta Heckle, MD [facility] 05/07/2023

## 2023-05-07 NOTE — Progress Notes (Addendum)
 Rounding Note    Patient Name: [name] Date of Encounter: 05/07/2023  [facility] Cardiologist: New this admission (has requested Dr. Londa Rival as he is the Primary Cardiologist for her mother)  Subjective   Episodes of atrial tachycardia overnight and this morning with episode overnight lasting from 2230 to 0100 with HR in the 130's and converted back to NSR. Brief episode this AM with HR in the 130's but lasting for less than 1 minute. Asymptomatic with both but reports having palpitations yesterday afternoon. Notes episodes at home of "chest fullness" intermittently over the past few months that has resembled what she feels when tachycardiac. No chest pain or shortness of breath.   Inpatient Medications    Scheduled Meds:  amoxicillin -clavulanate  1 tablet Oral BID   Chlorhexidine  Gluconate Cloth  6 each Topical Q0600   diltiazem   30 mg Oral Q6H   enoxaparin  (LOVENOX ) injection  55 mg Subcutaneous Q24H   FLUoxetine   20 mg Oral QODAY   FLUoxetine   40 mg Oral QODAY   pantoprazole   40 mg Oral Daily   rosuvastatin   5 mg Oral Daily   Continuous Infusions:  diltiazem  (CARDIZEM ) infusion Stopped (05/07/23 0233)   PRN Meds: acetaminophen  **OR** acetaminophen , guaiFENesin -dextromethorphan , metoprolol  tartrate, ondansetron  **OR** ondansetron  (ZOFRAN ) IV   Vital Signs    Vitals:   05/07/23 0600 05/07/23 0630 05/07/23 0700 05/07/23 0757  BP: 130/82 (!) 107/58 113/60   Pulse: 75 69 72   Resp: 14 (!) 23 (!) 21   Temp:    97.9 F (36.6 C)  TempSrc:    Oral  SpO2: 100% 97% 100% 99%  Weight:      Height:        Intake/Output Summary (Last 24 hours) at 05/07/2023 0802 Last data filed at 05/07/2023 0326 Gross per 24 hour  Intake 288.41 ml  Output --  Net 288.41 ml      05/05/2023    3:03 PM 05/05/2023   11:22 AM 09/10/2022    9:51 AM  Last 3 Weights  Weight (lbs) 244 lb 4.3 oz 228 lb 237 lb  Weight (kg) 110.8 kg 103.42 kg 107.502 kg      Telemetry     Atrial tachycardia with HR in the 130's from 2230 to 0100 with return to NSR with HR in the 60's. Brief tachycardia at 0816 today but converted back to NSR within 1 minute. - Personally Reviewed  Physical Exam   GEN: No acute distress.   Neck: No JVD Cardiac: RRR, no murmurs, rubs, or gallops.  Respiratory: Expiratory wheezing. No rales. GI: Soft, nontender, non-distended  MS: No pitting edema; No deformity. Neuro:  Nonfocal  Psych: Normal affect   Labs    High Sensitivity Troponin:   Recent Labs  Lab 05/05/23 1156  TROPONINIHS 12     Chemistry Recent Labs  Lab 05/05/23 1156 05/06/23 0435 05/07/23 0447  NA 139 138 138  K 3.9 3.9 4.5  CL 107 108 107  CO2 23 22 25   GLUCOSE 93 101* 96  BUN 17 15 16   CREATININE 0.63 0.55 0.70  CALCIUM  8.5* 8.6* 8.8*  MG 2.0 2.0 2.0  PROT 6.6  --   --   ALBUMIN 3.3*  --   --   AST 15  --   --   ALT 11  --   --   ALKPHOS 46  --   --   BILITOT 0.4  --   --   GFRNONAA >60 >60 >  60  ANIONGAP 9 8 6     Lipids No results for input(s): "CHOL", "TRIG", "HDL", "LABVLDL", "LDLCALC", "CHOLHDL" in the last 168 hours.  Hematology Recent Labs  Lab 05/05/23 1156 05/06/23 0435 05/07/23 0447  WBC 5.1 6.5 7.4  RBC 3.70* 3.50* 3.67*  HGB 10.4* 10.1* 10.4*  HCT 33.7* 31.8* 33.4*  MCV 91.1 90.9 91.0  MCH 28.1 28.9 28.3  MCHC 30.9 31.8 31.1  RDW 14.6 14.7 14.8  PLT 267 263 280   Thyroid  Recent Labs  Lab 05/05/23 1156  TSH 1.359    BNP Recent Labs  Lab 05/05/23 1156  BNP 147.0*    DDimer No results for input(s): "DDIMER" in the last 168 hours.   Radiology    DG Chest Port 1 View Result Date: 05/05/2023 CLINICAL DATA:  Dyspnea. Supraventricular tachycardia. Sinus infection. EXAM: PORTABLE CHEST - 1 VIEW COMPARISON:  None available. FINDINGS: Cardiomediastinal silhouette and pulmonary vasculature are within normal limits. Lungs are clear. Severe bilateral glenohumeral osteoarthrosis. IMPRESSION: No acute cardiopulmonary process.  Electronically Signed   By: Elester Grim M.D.   On: 05/05/2023 15:42    Cardiac Studies   Echocardiogram: Pending for today  Patient Profile     68 y.o. female w/ PMH of HLD, GERD and anxiety who was admitted to [facility] on 05/05/2023 after having a HR in the 150's at Urgent Care. Felt to be most consistent with atrial tachycardia. Also being treated for acute sinusitis by the admitting team.   Assessment & Plan    1. Atrial Tachycardia - New diagnosis for the patient this admission and unclear if due to her sinusitis. TSH normal at 1.359. Electrolytes WNL as well with Mg at 2.0 and K+ at 3.9 when checked on admission.   - Initially received Adenosine  6mg  x3 and 12mg  x1 and temporarily on IV Amiodarone  then transitioned to IV Cardizem . IV Cardizem  was able to be discontinued overnight with transition to PO Cardizem  30mg  Q6H. BP stable at 113/60 this morning. Pending echo, can likely switch to Cardizem  CD 120mg  daily if EF is preserved.  - She may benefit from an outpatient monitor to assess for recurrence as she reports having episodes intermittently at home over the past few months. Depending on HR control and burden, may benefit from EP referral for consideration of antiarrhythmic options.   2. Sinusitis - Remains on Augmentin . Management per the admitting team.   3. Anemia - Hgb at 10.4 today. Further work-up per the admitting team.   For questions or updates, please contact [facility] Please consult [email or web address] for contact info under        Signed, Dorma Gash, PA-C  05/07/2023, 8:02 AM     Attending Note   Patient seen and discussed with PA Finis Hugger, I agree with her documentation.    1.Atach - new diagnosis this admission - asymptomatic, initially presented to urgent care with cough and congestion, found to be tachycardic - was on dilt gtt yesterday, yesterday transitioned to oral. On diltiazem  30mg  every 6 hrs - soft bp's, continue short acting  diltiazem  this morning and likely consolidate later today or tomorrow - f/u echo   Would anticipate likely discharge later today pending echo and heart rates. Would plan for 1 week zio patch to assess ongoing arrhythmia burden, she may ultimately require an alternative strategy if not able to be controlled with av nodal agent alone and may require EP evaluation as outpatient.   Armida Lander MD

## 2023-05-07 NOTE — Telephone Encounter (Signed)
Monitor ordered and shipped to pt's home.

## 2023-05-07 NOTE — Care Management Important Message (Signed)
 Important Message  Patient Details  Name: [name] MRN: [id number] Date of Birth: 09/02/1955   Important Message Given:  N/A - LOS <3 / Initial given by admissions     Neila Bally 05/07/2023, 11:04 AM

## 2023-05-07 NOTE — Telephone Encounter (Signed)
-----   Message from [place] sent at 05/07/2023  9:14 AM EDT ----- Regarding: Monitor Good morning,   This patient is likely going home today and needs a 1-week non-live ZIO patch for atrial tachycardia and palpitations per Dr. Amanda Jungling. If it cannot be placed prior to discharge today, can be mailed to the patient.   Thanks,  [place]

## 2023-05-07 NOTE — Progress Notes (Signed)
  Echocardiogram 2D Echocardiogram has been performed.  Annis Kinder, RDCS 05/07/2023, 9:45 AM

## 2023-05-07 NOTE — Progress Notes (Signed)
 Office Visit Note  Patient: [name]             Date of Birth: [birth year]           MRN: [id number]             PCP: Orest Bio, MD Referring: Orest Bio, MD Visit Date: 05/20/2023   Subjective:  Follow-up (Patient states the gel shots helped. )   Discussed the use of AI scribe software for clinical note transcription with the patient, who gave verbal consent to proceed.  History of Present Illness   [name] is a 68 y.o. female here for follow up for osteoarthritis with previous b/l knee Hylan injections 11/19/22.   She previously received gel injections in her knees approximately six months ago. Initially, she did not notice a significant difference, but over time, she experienced improvement in her pain levels. Unlike the steroid injections, which provided immediate but short-lived relief, the gel injections have allowed her to maintain a more active lifestyle, including caring for her 72 year old mother and performing daily activities such as grocery shopping and refueling her car.  She experiences difficulty standing for extended periods due to her knees feeling like they are 'locking up.' She uses a cane for mobility and relies on support from objects around her. She applies Voltaren topically when she remembers, especially when her knees feel stiff, and takes turmeric daily as a supplement for her knee pain.  She has experienced increased ankle swelling recently, which she attributes to fluid pooling and varicose veins. She has a family history of similar venous issues on her mother's side, affecting both males and females. She has had treatment for varicose veins in the past, but new ones have developed over time. Both legs are swelling, and while there is some discomfort, it is not particularly painful.  No prior history of heart issues, but she recently experienced a rapid heart rate of 160 bpm during a sinus infection, which led to an emergency room visit.       Previous HPI 09/10/22 [name] is a 68 y.o. female here for follow up for generalized osteoarthritis and some left leg swelling suspected as thrombophlebitis.  Steroid injections for both knees last visit with improvement she felt was a little bit longer than after the first injections, at least one month of a good relief. She also continues taking tylenol  regularly and topical voltaren and topical heat as needed especially for the left knee. She remains very active taking care of her 33 year old mother and knees lock up more commonly after about 1 hour on her feet or longer.   Previous HPI 06/11/2022 [name] is a 68 y.o. female here for follow up follow-up of generalized osteoarthritis and some left leg swelling suspected as thrombophlebitis.  Since her last visit continues joint pain and stiffness in multiple areas currently most problematic affecting both knees.  Usually worse with prolonged standing and walking.  Tender area of swelling on the left lateral knee has decreased in size and tenderness though not entirely resolved.  No new skin rash or increase in edema.   Previous HPI 04/09/22 [name] is a 69 y.o. female here for follow up for osteoarthritis joint pain involving shoulders and knees.  After repeat steroid injection 2 months ago she had symptom improvement.  Shoulder pain is doing well on both sides though she still limited with her overhead range of motion.  Her knee  injection from about 4 months ago starting to notice a little bit more pain and stiffness though still better than prior to treatment.  She has noticed some increased swelling in her left leg and there is a firm nodule or lump under the skin she notices just below the level of the knee.  Small increase in redness in that leg swelling.  With her starting to notice increased left knee symptoms again she has questions about viscosupplementation treatment.   Previous HPI 02/12/22 [name] is a 68 y.o. female  here for follow up for chronic osteoarthritis pain in multiple areas we did steroid injections in the right shoulder and left knee last month. She reports symptoms improved from 10/10 pain to about 5-6/10 pain after these shots. Also less aching or radiating pain. She has slight improvement in mobility and function in the right shoulder. Since the original shot notices some decline in the benefit so far, back to about 8/10 knee pain. She is interested in trying injection for her other shoulder and knee which remain problematic.   Previous HPI 01/03/22 [name] is a 67 y.o. female here for follow up for osteoarthritis in multiple areas also right shoulder xray suggestive for some subacromial bursitis or chronic injury with displacement or dislocation. Symptoms remain about the same since our initial visit. Right shoulder is the worst and limited ROM is difficult to dress. Knee pain remains most problematic along medial side of her joints.    Previous HPI 12/12/21 [name] is a 68 y.o. female here fgr evaluation of positive ANA and multiple joint pains. She has discontinued NSAIDs due to left sided colitis episodes without certain etiology established except suspected medication related. Has chronic joint pain worse now in multiple areas workup labs negative for RF, ESR, CRP, but low positive ANA 1:80 with negative ENA panel. She has also described hair loss in a generalized pattern and increased in longitudinal nail ridges these been worse during this recent time since the colitis symptoms.  She has had chronic joint pain in bilateral knees ongoing for years.  She estimates at least 5 or more years been self medicating with Aleve which is very beneficial for her joint pain and stiffness.  Initially taking daily and then had increased to twice daily consistently for much of this time.  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  She  stopped this completely since her colitis problems and these have resolved but in addition to worsening of her knee pain and stiffness also developed trouble in her upper extremities and both shoulders and both hands especially the right shoulder.  Also abruptly developed a palpable knot and swelling in the right shoulder with decreased range of motion in the past year.  She does use a cane for stability and notices leaning on counters or carts more to offload her knees with prolonged standing or walking. She has not noticed any new problem with skin rashes, oral nasal ulcers, Raynaud's symptoms, no abnormal bruising or bleeding outside of the colitis.  She does have dry eye symptoms these are worse when reading or on a screen for prolonged periods but does not require eyedrops for symptoms every day.  She was also found to have very low serum vitamin D  level at lab testing earlier this year.   Review of Systems  Constitutional:  Negative for fatigue.  HENT:  Negative for mouth  sores and mouth dryness.   Eyes:  Negative for dryness.  Respiratory:  Negative for shortness of breath.   Cardiovascular:  Positive for palpitations. Negative for chest pain.  Gastrointestinal:  Negative for blood in stool, constipation and diarrhea.  Endocrine: Negative for increased urination.  Genitourinary:  Positive for involuntary urination.  Musculoskeletal:  Positive for joint pain, gait problem, joint pain, joint swelling and morning stiffness. Negative for myalgias, muscle weakness, muscle tenderness and myalgias.  Skin:  Negative for color change, rash, hair loss and sensitivity to sunlight.  Allergic/Immunologic: Negative for susceptible to infections.  Neurological:  Negative for dizziness and headaches.  Hematological:  Negative for swollen glands.  Psychiatric/Behavioral:  Positive for depressed mood. Negative for sleep disturbance. The patient is nervous/anxious.     PMFS History:  Patient Active Problem  List   Diagnosis Date Noted   Bilateral primary osteoarthritis of knee 05/20/2023   Atrial fibrillation with RVR (HCC) 05/05/2023   SVT (supraventricular tachycardia) (HCC) 05/05/2023   Thrombophlebitis 04/09/2022   Generalized osteoarthritis of multiple sites 01/03/2022   Bilateral shoulder pain 01/03/2022   Positive ANA (antinuclear antibody) 12/12/2021   Vitamin D  deficiency 12/12/2021   Joint pain 09/05/2021   Multiple joint pain 09/05/2021   Hair loss 09/05/2021   Colitis 01/17/2021   Rectal bleeding 02/10/2020   Gastric polyps 02/10/2020   GERD (gastroesophageal reflux disease) 02/10/2020   Diarrhea 02/10/2020   Postmenopausal bleeding 07/20/2019   Symptomatic cholelithiasis 11/27/2018   Carpal tunnel syndrome on left 12/14/2014   Endometrial 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, complex 04/20/2014    Past Medical History:  Diagnosis Date   Anxiety    Arthritis    knees, thumbs   Atrial tachycardia (HCC)    Carpal tunnel syndrome of right wrist 09/2014   GERD (gastroesophageal reflux disease)    History of kidney stones 07/2014   Plantar fascial fibromatosis of left foot    Sinus problem     Family History  Problem Relation Age of Onset   Diabetes Mother    Stroke Mother    Osteoarthritis Mother    Heart disease Father    Cancer Maternal Aunt        Breast   Colon cancer Neg Hx    Gastric cancer Neg Hx    Esophageal cancer Neg Hx    Past Surgical History:  Procedure Laterality Date   BIOPSY  09/04/2020   Procedure: BIOPSY;  Surgeon: Vinetta Greening, DO;  [facility]: [facility];  Service: Endoscopy;;  descending colon polyp   CARPAL TUNNEL RELEASE Right 10/04/2014   Procedure: RIGHT CARPAL TUNNEL RELEASE;  Surgeon: Lyanne Sample, MD;  Location: [facility];  Service: Orthopedics;  Laterality: Right;  REG/FAB   CARPAL TUNNEL RELEASE Left 11/15/2014   Procedure: LEFT CARPAL TUNNEL RELEASE;  Surgeon: Lyanne Sample, MD;  Location: [facility];   Service: Orthopedics;  Laterality: Left;   CERVICAL CONIZATION W/BX  04/2014   CHOLECYSTECTOMY  11/30/2014   COLONOSCOPY WITH PROPOFOL  N/A 09/04/2020   Procedure: COLONOSCOPY WITH PROPOFOL ;  Surgeon: Vinetta Greening, DO;  Location: [facility];  Service: Endoscopy;  Laterality: N/A;  7:30am   DILATION AND CURETTAGE OF UTERUS  04/2014   DILATION AND CURETTAGE OF UTERUS N/A 08/10/2019   Procedure: DILATATION AND CURETTAGE OF THE UTERUS UNDER ULTRASOUND GUIDANCE;  Surgeon: Alphonso Aschoff, MD;  Location: [facility];  Service: Gynecology;  Laterality: N/A;   HYMENECTOMY  age 69 or age 29  hysteroscoptic insertion of mirena  IUD  08/2015   HYSTEROSCOPY  02/2014   INTRAUTERINE DEVICE (IUD) INSERTION N/A 08/10/2019   Procedure: INTRAUTERINE DEVICE (IUD) REMOVAL AND REINSERTION;  Surgeon: Alphonso Aschoff, MD;  Location: [facility];  Service: Gynecology;  Laterality: N/A;  PHARMACY MIRENA  IUD   OPERATIVE ULTRASOUND N/A 08/10/2019   Procedure: OPERATIVE ULTRASOUND;  Surgeon: Alphonso Aschoff, MD;  Location: [facility];  Service: Gynecology;  Laterality: N/A;   uterine polyps removed  06/03/2018   VARICOSE VEIN SURGERY Bilateral 15 yrs ago   tx with injections only no sx   WISDOM TOOTH EXTRACTION     as a teenager   Social History   Social History Narrative   Not on file    There is no immunization history on file for this patient.   Objective: Vital Signs: BP 123/74 (BP Location: Left Arm, Patient Position: Sitting, Cuff Size: Large)   Pulse 62   Resp 14   Ht 5' (1.524 m)   Wt 239 lb (108.4 kg)   BMI 46.68 kg/m    Physical Exam Constitutional:      Appearance: She is obese.     Comments: Walking with cane  Eyes:     Conjunctiva/sclera: Conjunctivae normal.  Cardiovascular:     Rate and Rhythm: Normal rate and regular rhythm.  Pulmonary:     Effort: Pulmonary effort is normal.     Breath sounds: Normal breath sounds.  Lymphadenopathy:      Cervical: No cervical adenopathy.  Skin:    General: Skin is warm and dry.     Comments: Torturous superficial vein varicosities in both legs, faint erythema of skin on medial side about halfway up both shins  Neurological:     Mental Status: She is alert.  Psychiatric:        Mood and Affect: Mood normal.      Musculoskeletal Exam:  Significantly restricted shoulder range of motion especially with overhead abduction and external rotation, right humerus in low position Elbows full ROM no tenderness or swelling Wrists full ROM no tenderness or swelling Fingers full ROM no tenderness or swelling Knees tenderness on full flexion and extension is mild, full extension, flexion to about 110 degrees, no palpable swelling, right knee crepitus   Investigation: No additional findings.  Imaging: ECHOCARDIOGRAM COMPLETE Result Date: 05/07/2023    ECHOCARDIOGRAM REPORT   Patient Name:   ESHA FINCHER Boomer Winders Date of Exam: 05/07/2023 Medical Rec #:  [id number]    Height:       60.0 in Accession #:    [id number]   Weight:       244.3 lb Date of Birth:  02/22/1955    BSA:          2.032 m Patient Age:    67 years     BP:           111/57 mmHg Patient Gender: F            HR:           74 bpm. Exam Location:  [facility] Procedure: 2D Echo, Color Doppler and Cardiac Doppler (Both Spectral and Color            Flow Doppler were utilized during procedure). Indications:    R94.31 Abnormal EKG  History:        Patient has no prior history of Echocardiogram examinations.  Sonographer:    Andrena Bang Referring Phys: [id number] PRATIK [facility] IMPRESSIONS  1.  Left ventricular ejection fraction, by estimation, is 50%. The left ventricle has low normal function. Left ventricular endocardial border not optimally defined to evaluate regional wall motion. There is mild left ventricular hypertrophy. Left ventricular diastolic parameters were normal.  2. Right ventricular systolic function is normal. The right ventricular size is normal.  Tricuspid regurgitation signal is inadequate for assessing PA pressure.  3. Left atrial size was moderately dilated.  4. The mitral valve is abnormal. Moderate mitral valve regurgitation. No evidence of mitral stenosis.  5. The tricuspid valve is abnormal.  6. The aortic valve was not well visualized. Aortic valve regurgitation is not visualized. No aortic stenosis is present.  7. The inferior vena cava is dilated in size with >50% respiratory variability, suggesting right atrial pressure of 8 mmHg. FINDINGS  Left Ventricle: Left ventricular ejection fraction, by estimation, is 50%. The left ventricle has low normal function. Left ventricular endocardial border not optimally defined to evaluate regional wall motion. The left ventricular internal cavity size was normal in size. There is mild left ventricular hypertrophy. Left ventricular diastolic parameters were normal. Right Ventricle: The right ventricular size is normal. Right vetricular wall thickness was not well visualized. Right ventricular systolic function is normal. Tricuspid regurgitation signal is inadequate for assessing PA pressure. Left Atrium: Left atrial size was moderately dilated. Right Atrium: Right atrial size was not well visualized. Pericardium: There is no evidence of pericardial effusion. Mitral Valve: The mitral valve is abnormal. Moderate mitral valve regurgitation. No evidence of mitral valve stenosis. Tricuspid Valve: The tricuspid valve is abnormal. Tricuspid valve regurgitation is mild . No evidence of tricuspid stenosis. Aortic Valve: The aortic valve was not well visualized. Aortic valve regurgitation is not visualized. No aortic stenosis is present. Aortic valve mean gradient measures 3.0 mmHg. Aortic valve peak gradient measures 6.1 mmHg. Aortic valve area, by VTI measures 2.61 cm. Pulmonic Valve: The pulmonic valve was not well visualized. Pulmonic valve regurgitation is not visualized. No evidence of pulmonic stenosis. Aorta:  The aortic root is normal in size and structure. Venous: The inferior vena cava is dilated in size with greater than 50% respiratory variability, suggesting right atrial pressure of 8 mmHg. IAS/Shunts: No atrial level shunt detected by color flow Doppler.  LEFT VENTRICLE PLAX 2D LVIDd:         4.80 cm      Diastology LVIDs:         3.60 cm      LV e' medial:    8.38 cm/s LV PW:         1.20 cm      LV E/e' medial:  12.6 LV IVS:        1.20 cm      LV e' lateral:   13.40 cm/s LVOT diam:     2.00 cm      LV E/e' lateral: 7.9 LV SV:         63 LV SV Index:   31 LVOT Area:     3.14 cm  LV Volumes (MOD) LV vol d, MOD A2C: 157.0 ml LV vol d, MOD A4C: 123.0 ml LV vol s, MOD A2C: 81.7 ml LV vol s, MOD A4C: 74.4 ml LV SV MOD A2C:     75.3 ml LV SV MOD A4C:     123.0 ml LV SV MOD BP:      68.7 ml RIGHT VENTRICLE RV S prime:     16.90 cm/s TAPSE (M-mode): 2.3 cm LEFT ATRIUM  Index LA diam:        4.40 cm  2.17 cm/m LA Vol (A2C):   111.0 ml 54.62 ml/m LA Vol (A4C):   69.7 ml  34.30 ml/m LA Biplane Vol: 91.2 ml  44.88 ml/m  AORTIC VALVE AV Area (Vmax):    2.73 cm AV Area (Vmean):   2.55 cm AV Area (VTI):     2.61 cm AV Vmax:           123.00 cm/s AV Vmean:          86.000 cm/s AV VTI:            0.242 m AV Peak Grad:      6.1 mmHg AV Mean Grad:      3.0 mmHg LVOT Vmax:         107.00 cm/s LVOT Vmean:        69.900 cm/s LVOT VTI:          0.201 m LVOT/AV VTI ratio: 0.83  AORTA Ao Root diam: 2.60 cm MITRAL VALVE MV Area (PHT): 4.15 cm     SHUNTS MV Decel Time: 183 msec     Systemic VTI:  0.20 m MV E velocity: 106.00 cm/s  Systemic Diam: 2.00 cm MV A velocity: 47.40 cm/s MV E/A ratio:  2.24 Armida Lander MD Electronically signed by Armida Lander MD Signature Date/Time: 05/07/2023/9:58:13 AM    Final    DG Chest Port 1 View Result Date: 05/05/2023 CLINICAL DATA:  Dyspnea. Supraventricular tachycardia. Sinus infection. EXAM: PORTABLE CHEST - 1 VIEW COMPARISON:  None available. FINDINGS: Cardiomediastinal  silhouette and pulmonary vasculature are within normal limits. Lungs are clear. Severe bilateral glenohumeral osteoarthrosis. IMPRESSION: No acute cardiopulmonary process. Electronically Signed   By: Elester Grim M.D.   On: 05/05/2023 15:42    Recent Labs: Lab Results  Component Value Date   WBC 7.4 05/07/2023   HGB 10.4 (L) 05/07/2023   PLT 280 05/07/2023   NA 138 05/07/2023   K 4.5 05/07/2023   CL 107 05/07/2023   CO2 25 05/07/2023   GLUCOSE 96 05/07/2023   BUN 16 05/07/2023   CREATININE 0.70 05/07/2023   BILITOT 0.4 05/05/2023   ALKPHOS 46 05/05/2023   AST 15 05/05/2023   ALT 11 05/05/2023   PROT 6.6 05/05/2023   ALBUMIN 3.3 (L) 05/05/2023   CALCIUM  8.8 (L) 05/07/2023   GFRAA >60 08/06/2019    Speciality Comments: No specialty comments available.  Procedures:  No procedures performed Allergies: Aleve [naproxen]   Assessment / Plan:     Visit Diagnoses: Bilateral primary osteoarthritis of knee Osteoarthritis of knee Chronic osteoarthritis with previous gel injections showing gradual improvement. Current symptoms manageable, no immediate need for repeat injections.  Significantly longer duration of benefit at 61-month follow-up compared to previous steroid injections wearing off within less than 2 months.  Also supplementing treatments with turmeric and as needed Voltaren gel. - Consider repeat gel injections in 9-12 months based on symptom progression. - Schedule follow-up in 3 months, PRN, to assess need for further intervention. - Initiate prior authorization for potential future gel injections.  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 Bilateral lower extremity edema with venous stasis changes, likely related to venous insufficiency.     Orders: No orders of the defined types were placed in this encounter.  No orders of the defined types were placed in this  encounter.    Follow-Up Instructions: Return  in about 3 months (around 08/20/2023), or if symptoms worsen or fail to improve.   Matt Song, MD  Note - This record has been created using [employer].  Chart creation errors have been sought, but may not always  have been located. Such creation errors do not reflect on  the standard of medical care.

## 2023-05-07 NOTE — Plan of Care (Signed)
  Problem: Education: Goal: Knowledge of General Education information will improve Description: Including pain rating scale, medication(s)/side effects and non-pharmacologic comfort measures Outcome: Progressing   Problem: Nutrition: Goal: Adequate nutrition will be maintained Outcome: Progressing   Problem: Coping: Goal: Level of anxiety will decrease Outcome: Progressing   Problem: Pain Managment: Goal: General experience of comfort will improve and/or be controlled Outcome: Progressing   Problem: Safety: Goal: Ability to remain free from injury will improve Outcome: Progressing   Problem: Skin Integrity: Goal: Risk for impaired skin integrity will decrease Outcome: Progressing

## 2023-05-20 ENCOUNTER — Encounter: Payer: Self-pay | Admitting: Internal Medicine

## 2023-05-20 ENCOUNTER — Ambulatory Visit: Payer: Medicare HMO | Attending: Internal Medicine | Admitting: Internal Medicine

## 2023-05-20 VITALS — BP 123/74 | HR 62 | Resp 14 | Ht 60.0 in | Wt 239.0 lb

## 2023-05-20 DIAGNOSIS — M159 Polyosteoarthritis, unspecified: Secondary | ICD-10-CM

## 2023-05-20 DIAGNOSIS — M170 Bilateral primary osteoarthritis of knee: Secondary | ICD-10-CM | POA: Insufficient documentation

## 2023-05-20 DIAGNOSIS — M25562 Pain in left knee: Secondary | ICD-10-CM

## 2023-05-20 DIAGNOSIS — M25561 Pain in right knee: Secondary | ICD-10-CM | POA: Diagnosis not present

## 2023-05-30 DIAGNOSIS — R002 Palpitations: Secondary | ICD-10-CM | POA: Diagnosis not present

## 2023-05-30 DIAGNOSIS — R000 Tachycardia, unspecified: Secondary | ICD-10-CM | POA: Diagnosis not present

## 2023-06-13 ENCOUNTER — Encounter: Payer: Self-pay | Admitting: Physician Assistant

## 2023-06-13 ENCOUNTER — Ambulatory Visit: Payer: Self-pay | Admitting: Student

## 2023-06-13 ENCOUNTER — Encounter: Payer: Self-pay | Admitting: *Deleted

## 2023-06-13 ENCOUNTER — Ambulatory Visit: Attending: Student | Admitting: Physician Assistant

## 2023-06-13 VITALS — BP 124/86 | HR 148 | Ht 60.0 in | Wt 237.8 lb

## 2023-06-13 DIAGNOSIS — I471 Supraventricular tachycardia, unspecified: Secondary | ICD-10-CM | POA: Diagnosis not present

## 2023-06-13 DIAGNOSIS — R002 Palpitations: Secondary | ICD-10-CM

## 2023-06-13 DIAGNOSIS — R000 Tachycardia, unspecified: Secondary | ICD-10-CM | POA: Diagnosis not present

## 2023-06-13 DIAGNOSIS — E785 Hyperlipidemia, unspecified: Secondary | ICD-10-CM | POA: Diagnosis not present

## 2023-06-13 DIAGNOSIS — R0683 Snoring: Secondary | ICD-10-CM

## 2023-06-13 DIAGNOSIS — I4719 Other supraventricular tachycardia: Secondary | ICD-10-CM

## 2023-06-13 MED ORDER — DILTIAZEM HCL ER COATED BEADS 180 MG PO CP24: 180.0000 mg | ORAL_CAPSULE | Freq: Every day | ORAL | 3 refills | Status: DC

## 2023-06-13 NOTE — Progress Notes (Signed)
 Cardiology Office Note:  .   Date:  06/13/2023  ID:  [name], DOB 04/26/1955, MRN [id number] PCP: Shelia Bio, MD  [facility] Providers Cardiologist:  Shelia Favre, MD  - NEW (per [name] note, request [name] because mother sees him) History of Present Illness: .    [name] is a 68 y.o. female  with PMHx of atrial tachycardia, HLD, GERD, anxiety, varicose veins,  dumping syndrome who reports to office for hospital follow-up/new patient.   Last seen in hospital consult 04/2023 for evaluation of tachycardia with [name].  EKG was reviewed and showed new diagnosis of atrial tachycardia.  Normal TSH, K, and Mg.  Patient received adenosine  6 mg x 3 followed by 12 mg x 1, IV amiodarone  temporarily then transition to IV Cardizem , then switch to p.o. Cardizem  and discharged on Cardizem  CD 120 mg daily. ECHO showed EF of 50%, low normal LV function, mild LVH, LA moderately dilated, and moderate MVR. Follow-up heart monitor showed predominant underlying rhythm of NSR with average HR of 76, 1 run of VT lasting 19 beats with max HR of 109, 51 runs of SVT with longest lasting 21 minutes and average HR of 146.   Today, report  episodes of lightheaded when getting up in the morning after stopping Cardizem  CD.  Ran out of Cardizem  CD on either Sunday or Monday. Reports being under a lot stress since caring for 61 y/o mother with dementia. Reports SOB with minimal exertion, relieved with rest.  Reports LE edema x 10 year due to varicose veins that is relieved with leg elevation, but not able to tolerate TED hoses. Denies CP, syncope. Since 30 years, sleeps with 2 pillows due to hx of severe sinusitis. Reports she snores terribly but relates to sinus issues. Reports healthy diet habitats with low salt and cholesterol. Denies tobacco use/Binging ETOH/drug use   Family cardiac history: Father (CAD with CABG, DES), mother (Afib),   Studies Reviewed: [name]   EKG  Interpretation Date/Time:  Friday Jun 13 2023 15:24:19 EDT Ventricular Rate:  144 PR Interval:  72 QRS Duration:  76 QT Interval:  290 QTC Calculation: 449 R Axis:   -5  Text Interpretation: Atrial Tachycardia vs SVT Minimal voltage criteria for LVH, may be normal variant ( R in aVL ) Nonspecific ST and T wave abnormality When compared with ECG of 05-May-2023 18:33, PR interval has decreased T wave inversion less evident in Inferior leads T wave inversion no longer evident in Anterior leads Confirmed by [name] ([id number]) on 06/13/2023 4:24:49 PM   Monitor 06/04/2023   7 day monitor   Rare supraventricular ectopy in the form of isolated PACs, couplets, triplets. 51 runs of SVT, longest 21 minutes.   Rare ventricular ectopy in the form of isolated PVCs, couplets. Isolated 19 beat run of NSVT vs SVT with aberrancy   Symproms correlated with runs of SVT.   Patch Wear Time:  6 days and 15 hours (2025-05-01T14:13:39-0400 to 2025-05-08T06:06:51-0400)   Patient had a min HR of 55 bpm, max HR of 211 bpm, and avg HR of 76 bpm. Predominant underlying rhythm was Sinus Rhythm. 1 run of Ventricular Tachycardia occurred lasting 19 beats with a max rate of 109 bpm (avg 103 bpm). 51 Supraventricular Tachycardia  runs occurred, the run with the fastest interval lasting 12 mins 3 secs with a max rate of 211 bpm, the longest lasting 21 mins 2 secs with an avg rate of 146 bpm.  Supraventricular Tachycardia was detected within +/- 45 seconds of symptomatic patient event(s). Isolated SVEs were rare (<1.0%), SVE Couplets were rare (<1.0%), and SVE Triplets were rare (<1.0%). Isolated VEs were rare (<1.0%), VE Couplets were rare (<1.0%), and no VE Triplets were present. Ventricular Bigeminy and Trigeminy were  present.    ECHO IMPRESSIONS 05/07/2023  1. Left ventricular ejection fraction, by estimation, is 50%. The left  ventricle has low normal function. Left ventricular endocardial border not  optimally  defined to evaluate regional wall motion. There is mild left  ventricular hypertrophy. Left  ventricular diastolic parameters were normal.   2. Right ventricular systolic function is normal. The right ventricular  size is normal. Tricuspid regurgitation signal is inadequate for assessing  PA pressure.   3. Left atrial size was moderately dilated.   4. The mitral valve is abnormal. Moderate mitral valve regurgitation. No  evidence of mitral stenosis.   5. The tricuspid valve is abnormal.   6. The aortic valve was not well visualized. Aortic valve regurgitation  is not visualized. No aortic stenosis is present.   7. The inferior vena cava is dilated in size with >50% respiratory  variability, suggesting right atrial pressure of 8 mmHg.   Physical Exam:   VS:  BP 124/86 (BP Location: Left Arm, Patient Position: Sitting, Cuff Size: Large)   Pulse (!) 148   Ht 5' (1.524 m)   Wt 237 lb 12.8 oz (107.9 kg)   SpO2 97%   BMI 46.44 kg/m    Wt Readings from Last 3 Encounters:  06/13/23 237 lb 12.8 oz (107.9 kg)  05/20/23 239 lb (108.4 kg)  05/05/23 244 lb 4.3 oz (110.8 kg)    GEN: Sitting in chair in no acute distress NECK: No JVD; No carotid bruits CARDIAC: tachycardiac, no murmurs, rubs, gallops RESPIRATORY:  Clear to auscultation without rales, wheezing or rhonchi  ABDOMEN: Soft, non-tender, non-distended EXTREMITIES:  No edema; No deformity, varicose veins   ASSESSMENT AND PLAN: .    Atrial tachycardia SVT Recent hospitalization 4/25, new diagnosis of atrial tachycardia.  04/2023: ECHO  EF of 50%, low normal LV function, mild LVH, LA moderately dilated, and moderate MVR.  05/2023: Follow-up heart monitor showed predominant underlying rhythm of NSR with average HR of 76, 1 run of VT lasting 19 beats with max HR of 109, 51 runs of SVT with longest lasting 21 minutes and average HR of 146. While on monitor, noted compliance with Cardizem  CD 120 mg.  EKG: Atrial tachycardia vs SVT, HR  144 Manually rechecked HR at 128, then rechecked again at 120.  Ran out of Cardizem  CD on either Sunday or Monday. Since, then reports episodes of lightheadedness only in the am.  Discussed with [name] and agreed with outpatient mgmt. Increased Cardizem  CD to 180 mg daily. Discussed starting today.  Will schedule for nurse visit on Tuesday 6/3 for EKG.  Referred to EP for further evaluation   HLD, goal < 100 2017: LDL 147, 04/2023: AST/ALT WNL Recent labs with PCP, will request.  Continue Crestor  5 mg every other day.   Snoring Reports she snores terribly but relates to sinus issues. Not interested in sleep study at this time, may consider in the future after arrhyhtmia treated.     Dispo: Ordered Cardizem  CD 180 mg daily. Nurse visit on Tuesday 6/3 for EKG. Referred to EP. Request labs from PCP.   Signed, Metta Actis, PA-C

## 2023-06-13 NOTE — Patient Instructions (Signed)
 Medication Instructions:   Increase Cardizem  CD to 180 mg Daily   Follow up Next week on Tuesday or Thursday   *If you need a refill on your cardiac medications before your next appointment, please call your pharmacy*  Lab Work: NONE   If you have labs (blood work) drawn today and your tests are completely normal, you will receive your results only by: MyChart Message (if you have MyChart) OR A paper copy in the mail If you have any lab test that is abnormal or we need to change your treatment, we will call you to review the results.  Testing/Procedures: NONE   Follow-Up: At [facility], you and your health needs are our priority.  As part of our continuing mission to provide you with exceptional heart care, our providers are all part of one team.  This team includes your primary Cardiologist (physician) and Advanced Practice Providers or APPs (Physician Assistants and Nurse Practitioners) who all work together to provide you with the care you need, when you need it.  Your next appointment:   1 -2  month(s)  Provider:   You may see Teddie Favre, MD or one of the following Advanced Practice Providers on your designated Care Team:   Woodfin Hays, PA-C  [place], [place] Theotis Flake, [place]     We recommend signing up for the patient portal called "MyChart".  Sign up information is provided on this After Visit Summary.  MyChart is used to connect with patients for Virtual Visits (Telemedicine).  Patients are able to view lab/test results, encounter notes, upcoming appointments, etc.  Non-urgent messages can be sent to your provider as well.   To learn more about what you can do with MyChart, go to [email or web address].   Other Instructions Thank you for choosing [facility]!

## 2023-06-15 DIAGNOSIS — G4730 Sleep apnea, unspecified: Secondary | ICD-10-CM

## 2023-06-15 HISTORY — DX: Sleep apnea, unspecified: G47.30

## 2023-06-17 ENCOUNTER — Ambulatory Visit: Attending: Cardiology

## 2023-06-17 VITALS — BP 118/80

## 2023-06-17 DIAGNOSIS — I471 Supraventricular tachycardia, unspecified: Secondary | ICD-10-CM

## 2023-06-17 DIAGNOSIS — I4719 Other supraventricular tachycardia: Secondary | ICD-10-CM | POA: Diagnosis not present

## 2023-06-17 MED ORDER — DILTIAZEM HCL ER 120 MG PO TB24: 120.0000 mg | ORAL_TABLET | Freq: Every day | ORAL | 3 refills | Status: DC

## 2023-06-17 MED ORDER — DILTIAZEM HCL ER COATED BEADS 120 MG PO CP24: 120.0000 mg | ORAL_CAPSULE | Freq: Every day | ORAL | 3 refills | Status: AC

## 2023-06-17 MED ORDER — METOPROLOL TARTRATE 25 MG PO TABS: 25.0000 mg | ORAL_TABLET | Freq: Two times a day (BID) | ORAL | 3 refills | Status: AC

## 2023-06-17 NOTE — Progress Notes (Signed)
 Patient presents today for nurse visit- EKG.  Patient was started on Cardizem  at last OV with provider for Atrial Tach/SVT. Patient stated that she has felt unsteady on her feet the morning when she started medication until mid-afternoon on Saturday. By Sunday, she felt better after taking medication.

## 2023-06-17 NOTE — Addendum Note (Signed)
 Addended by: Casper Clement on: 06/17/2023 04:25 PM   Modules accepted: Orders

## 2023-06-17 NOTE — Addendum Note (Signed)
 Addended by: Casper Clement on: 06/17/2023 04:48 PM   Modules accepted: Orders

## 2023-06-17 NOTE — Progress Notes (Addendum)
   Reviewed EKG with patient: Atrial tachycardia 140. Noted compliance with  Cardizem  CD 180 mg. Reported lightheaded on Saturday and Sunday. Sometimes feels palpitations "heart racing" when she is stressed. Denied any CP, SOB, edema, or syncope. BP today was 118/80, HR 138. Will decrease Cardizem  CD to 120 mg. Order Lopressor  25 mg BID. Encouraged BP monitoring at home. Encouraged to contact office if BP < 90/60 with symptoms. Will follow up with EP 06/23/2023 with Dr. Carolynne Citron.    Signed, Metta Actis, PA-C 06/17/2023, 4:26 PM Pager: [phone]

## 2023-06-17 NOTE — Progress Notes (Signed)
 Patient notified and verbalized understanding. Medication list updated. Patient will call office for any issues- sob, cp, dizziness after starting medication.

## 2023-06-17 NOTE — Patient Instructions (Signed)
 Medication Instructions:  Your physician has recommended you make the following change in your medication:   -Decrease Cardizem  to 120 mg once daily -Start Lopressor  25 mg twice daily   *If you need a refill on your cardiac medications before your next appointment, please call your pharmacy*  Lab Work: None If you have labs (blood work) drawn today and your tests are completely normal, you will receive your results only by: MyChart Message (if you have MyChart) OR A paper copy in the mail If you have any lab test that is abnormal or we need to change your treatment, we will call you to review the results.  Testing/Procedures: None  Follow-Up: At [facility], you and your health needs are our priority.  As part of our continuing mission to provide you with exceptional heart care, our providers are all part of one team.  This team includes your primary Cardiologist (physician) and Advanced Practice Providers or APPs (Physician Assistants and Nurse Practitioners) who all work together to provide you with the care you need, when you need it.  Your next appointment:    Keep follow up scheduled   Provider:   Manya Sells, MD    We recommend signing up for the patient portal called "MyChart".  Sign up information is provided on this After Visit Summary.  MyChart is used to connect with patients for Virtual Visits (Telemedicine).  Patients are able to view lab/test results, encounter notes, upcoming appointments, etc.  Non-urgent messages can be sent to your provider as well.   To learn more about what you can do with MyChart, go to [email or web address].   Other Instructions

## 2023-06-18 ENCOUNTER — Other Ambulatory Visit (HOSPITAL_COMMUNITY): Payer: Self-pay

## 2023-06-23 ENCOUNTER — Ambulatory Visit: Attending: Internal Medicine | Admitting: Internal Medicine

## 2023-06-23 ENCOUNTER — Telehealth: Payer: Self-pay | Admitting: *Deleted

## 2023-06-23 ENCOUNTER — Encounter: Payer: Self-pay | Admitting: Internal Medicine

## 2023-06-23 VITALS — BP 126/70 | HR 65 | Ht 60.0 in | Wt 241.0 lb

## 2023-06-23 DIAGNOSIS — I471 Supraventricular tachycardia, unspecified: Secondary | ICD-10-CM | POA: Diagnosis not present

## 2023-06-23 DIAGNOSIS — R002 Palpitations: Secondary | ICD-10-CM

## 2023-06-23 DIAGNOSIS — K2100 Gastro-esophageal reflux disease with esophagitis, without bleeding: Secondary | ICD-10-CM

## 2023-06-23 DIAGNOSIS — R0683 Snoring: Secondary | ICD-10-CM

## 2023-06-23 NOTE — Telephone Encounter (Signed)
 DR. Carolynne Citron ORDERED ITAMAR STUDY.   Patient agreement reviewed and signed on 06/23/2023.  WatchPAT issued to patient on 06/23/2023 by Alec Huntington, CMA. Patient aware to not open the WatchPAT box until contacted with the activation PIN. Patient profile initialized in CloudPAT on 06/23/2023 by Alec Huntington, CMA. Device serial number: [id number]  Please list Reason for Call as Advice Only and type "WatchPAT issued to patient" in the comment box.

## 2023-06-23 NOTE — Progress Notes (Signed)
 HPI [name] is referred by Odetta Benes, PA-C for evaluation of SVT. She is apleasant 68 yo woman with a h/o palpitations who presented back in April with heart racing of 158/min which was not responsive to adenosine  but was eventually controlled with cardizem . She wore a cardiac monitor as an outpatient and had recurrent SVT though lasting 21 minutes. She was seen in the [place] office just over a week ago with HR's of around 150/min and was prescribed lopressor  bid and her HR's have improved. She is only mildly symptomatic in SVT.  Allergies  Allergen Reactions   Aleve [Naproxen] Other (See Comments)    GI bleeding     Current Outpatient Medications  Medication Sig Dispense Refill   acetaminophen  (TYLENOL ) 500 MG tablet Take 1,000 mg by mouth See admin instructions. Take 1000 mg in the morning, may take an additional 1000 mg every 6 hours as needed for pain     Cholecalciferol (VITAMIN D ) 125 MCG (5000 UT) CAPS Take by mouth.     COLLAGEN PO Take by mouth.     diltiazem  (CARDIZEM  CD) 120 MG 24 hr capsule Take 1 capsule (120 mg total) by mouth daily. 90 capsule 3   FLUoxetine  (PROZAC ) 20 MG capsule Take by mouth. Take 40 mg every other day and 20 mg on opposite days     fluticasone  (FLONASE ) 50 MCG/ACT nasal spray Place 2 sprays into both nostrils daily. 16 g 0   guaiFENesin -dextromethorphan  (ROBITUSSIN DM) 100-10 MG/5ML syrup Take 10 mLs by mouth every 4 (four) hours as needed for cough. 118 mL 0   metoprolol  tartrate (LOPRESSOR ) 25 MG tablet Take 1 tablet (25 mg total) by mouth 2 (two) times daily. 180 tablet 3   omeprazole (PRILOSEC) 20 MG capsule Take 20 mg by mouth daily.     rosuvastatin  (CRESTOR ) 5 MG tablet Take 5 mg by mouth every other day.     TURMERIC PO Take by mouth daily.     No current facility-administered medications for this visit.     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   Past Surgical History:  Procedure Laterality Date   BIOPSY  09/04/2020   Procedure: BIOPSY;  Surgeon: Vinetta Greening, DO;  [facility];  Service: Endoscopy;;  descending colon polyp   CARPAL TUNNEL RELEASE Right 10/04/2014   Procedure: RIGHT CARPAL TUNNEL RELEASE;  Surgeon: Lyanne Sample, MD;  Location: [facility];  Service: Orthopedics;  Laterality: Right;  REG/FAB   CARPAL TUNNEL RELEASE Left 11/15/2014   Procedure: LEFT CARPAL TUNNEL RELEASE;  Surgeon: Lyanne Sample, MD;  Location: [facility];  Service: Orthopedics;  Laterality: Left;   CERVICAL CONIZATION W/BX  04/2014   CHOLECYSTECTOMY  11/30/2014   COLONOSCOPY WITH PROPOFOL  N/A 09/04/2020   Procedure: COLONOSCOPY WITH PROPOFOL ;  Surgeon: Vinetta Greening, DO;  [facility];  Service: Endoscopy;  Laterality: N/A;  7:30am   DILATION AND CURETTAGE OF UTERUS  04/2014   DILATION AND CURETTAGE OF UTERUS N/A 08/10/2019   Procedure: DILATATION AND  CURETTAGE OF THE UTERUS UNDER ULTRASOUND GUIDANCE;  Surgeon: Alphonso Aschoff, MD;  Location: [facility];  Service: Gynecology;  Laterality: N/A;   HYMENECTOMY  age 24 or age 42   hysteroscoptic insertion of mirena  IUD  08/2015   HYSTEROSCOPY  02/2014   INTRAUTERINE DEVICE (IUD) INSERTION N/A 08/10/2019   Procedure: INTRAUTERINE DEVICE (IUD) REMOVAL AND REINSERTION;  Surgeon: Alphonso Aschoff, MD;  Location: [facility];  Service: Gynecology;  Laterality: N/A;  PHARMACY MIRENA  IUD   OPERATIVE ULTRASOUND N/A 08/10/2019   Procedure: OPERATIVE ULTRASOUND;  Surgeon: Alphonso Aschoff, MD;  Location: [facility];  Service: Gynecology;  Laterality: N/A;   uterine polyps  removed  06/03/2018   VARICOSE VEIN SURGERY Bilateral 15 yrs ago   tx with injections only no sx   WISDOM TOOTH EXTRACTION     as a teenager     Family History  Problem Relation Age of Onset   Diabetes Mother    Stroke Mother    Osteoarthritis Mother    Heart disease Father    Cancer Maternal Aunt        Breast   Colon cancer Neg Hx    Gastric cancer Neg Hx    Esophageal cancer Neg Hx      Social History   Socioeconomic History   Marital status: Widowed    Spouse name: Not on file   Number of children: Not on file   Years of education: Not on file   Highest education level: Not on file  Occupational History   Not on file  Tobacco Use   Smoking status: Never    Passive exposure: Past   Smokeless tobacco: Never  Vaping Use   Vaping status: Never Used  Substance and Sexual Activity   Alcohol use: No   Drug use: No   Sexual activity: Not Currently  Other Topics Concern   Not on file  Social History Narrative   Not on file   Social Drivers of Health   Financial Resource Strain: Not on file  Food Insecurity: No Food Insecurity (05/05/2023)   Hunger Vital Sign    Worried About Running Out of Food in the Last Year: Never true    Ran Out of Food in the Last Year: Never true  Transportation Needs: No Transportation Needs (05/05/2023)   PRAPARE - Administrator, Civil Service (Medical): No    Lack of Transportation (Non-Medical): No  Physical Activity: Not on file  Stress: Not on file  Social Connections: Moderately Integrated (05/05/2023)   Social Connection and Isolation Panel [NHANES]    Frequency of Communication with Friends and Family: More than three times a week    Frequency of Social Gatherings with Friends and Family: More than three times a week    Attends Religious Services: More than 4 times per year    Active Member of [employer] or Organizations: Yes    Attends Banker Meetings: More than 4 times per year    Marital Status:  Widowed  Intimate Partner Violence: Not At Risk (05/05/2023)   Humiliation, Afraid, Rape, and Kick questionnaire    Fear of Current or Ex-Partner: No    Emotionally Abused: No    Physically Abused: No    Sexually Abused: No     BP 126/70   Pulse 65   Ht 5' (1.524 m)   Wt 241 lb (109.3 kg)   SpO2 97%   BMI 47.07 kg/m   Physical  Exam:  Obese but well appearing NAD HEENT: Unremarkable Neck:  No JVD, no thyromegally Lymphatics:  No adenopathy Back:  No CVA tenderness Lungs:  Clear with no wheezes HEART:  Regular rate rhythm, no murmurs, no rubs, no clicks Abd:  soft, positive bowel sounds, no organomegally, no rebound, no guarding Ext:  2 plus pulses, no edema, no cyanosis, no clubbing Skin:  No rashes no nodules Neuro:  CN II through XII intact, motor grossly intact   Assess/Plan: SVT - looks like atrial tachy originating from near the CS os is the likely diagnosis. On a combination of 120 of cardizem  and 25 bid of lopressor , her heart rates and symptoms have improved. She will undergo watchful waiting. If her symptoms remain well controlled, I will continue. If she has recurrent SVT, then catheter ablation is indicated.  Snoring - she admits to snoring but does not think that she stops breathing. I will have her undergo home sleep study. Obesity - she will need to work on weight loss.  Pete Brand Zeus Marquis,MD

## 2023-06-23 NOTE — Patient Instructions (Addendum)
 Medication Instructions:  Your physician recommends that you continue on your current medications as directed. Please refer to the Current Medication list given to you today.  *If you need a refill on your cardiac medications before your next appointment, please call your pharmacy*  Lab Work: None ordered.  You may go to any [facility] Location for your lab work:  [facility] - [id number] ([facility]) - [address] [phone] [address]  [place] - [address]   [place]  - [address]   [place] - [address] - [address] [employer]  - [address] - [address] ([facility]   If you have labs (blood work) drawn today and your tests are completely normal, you will receive your results only by: [employer] (if you have MyChart)  If you have any lab test that is abnormal or we need to change your treatment, we will call you or send a MyChart message to review the results.  Testing/Procedures: Sleep study  Follow-Up: At [facility], you and your health needs are our priority.  As part of our continuing mission to provide you with exceptional heart care, we have created designated Provider Care Teams.  These Care Teams include your primary Cardiologist (physician) and Advanced Practice Providers (APPs -  Physician Assistants and Nurse Practitioners) who all work together to provide you with the care you need, when you need it.   Your next appointment:   2/3 months in [place]  The format for your next appointment:   In Person  Provider:   Manya Sells, MD{or one of the following Advanced Practice Providers on your designated Care Team:   Mertha Abrahams, [place] [address] [place], [place] Neda Balk, NP

## 2023-07-10 ENCOUNTER — Telehealth: Payer: Self-pay | Admitting: Internal Medicine

## 2023-07-10 NOTE — Telephone Encounter (Signed)
 Pt would like to apply for visco in [place]IL knees

## 2023-07-10 NOTE — Telephone Encounter (Signed)
 I will forward to the sleep team to reach out to the pt.

## 2023-07-10 NOTE — Telephone Encounter (Signed)
Patient is following up requesting updates.

## 2023-07-11 ENCOUNTER — Telehealth: Payer: Self-pay

## 2023-07-11 NOTE — Addendum Note (Signed)
**Note De-Identified Marrell Dicaprio Obfuscation** Addended by: LAURENT AVELINA HERO on: 07/11/2023 10:11 AM   Modules accepted: Orders

## 2023-07-11 NOTE — Telephone Encounter (Signed)
**Note De-Identified Jensyn Shave Obfuscation** No active order for a Itamar-HST so I have placed the order per Dr Iven office visit notes from 06/23/23. I am currently working on this [employer].

## 2023-07-11 NOTE — Telephone Encounter (Signed)
**Note De-Identified Parks Czajkowski Obfuscation** Ordering provider: Dr Waddell Associated diagnoses: Snoring-R06.83 and Obesity-E66.01  WatchPAT PA obtained on 07/11/2023 by Josimar Corning, Avelina HERO, LPN. Authorization: Per the [employer], PA is not required for CPT Code: 04199 (Itamar-HST).  Patient notified of PIN ([id number]) on 07/11/2023 Drexel Ivey Notification Method: phone.  Phone note routed to covering staff for follow-up.

## 2023-07-11 NOTE — Telephone Encounter (Signed)
**Note De-Identified Helix Lafontaine Obfuscation** See the 6/27 phone note (Itamar-HST PA) in the pts chart for update.

## 2023-07-15 ENCOUNTER — Encounter (INDEPENDENT_AMBULATORY_CARE_PROVIDER_SITE_OTHER): Payer: Self-pay | Admitting: Cardiology

## 2023-07-15 DIAGNOSIS — G4733 Obstructive sleep apnea (adult) (pediatric): Secondary | ICD-10-CM

## 2023-07-24 DIAGNOSIS — E782 Mixed hyperlipidemia: Secondary | ICD-10-CM | POA: Diagnosis not present

## 2023-07-24 DIAGNOSIS — Z6841 Body Mass Index (BMI) 40.0 and over, adult: Secondary | ICD-10-CM | POA: Diagnosis not present

## 2023-07-24 DIAGNOSIS — M170 Bilateral primary osteoarthritis of knee: Secondary | ICD-10-CM | POA: Diagnosis not present

## 2023-07-24 DIAGNOSIS — F3342 Major depressive disorder, recurrent, in full remission: Secondary | ICD-10-CM | POA: Diagnosis not present

## 2023-07-24 DIAGNOSIS — F411 Generalized anxiety disorder: Secondary | ICD-10-CM | POA: Diagnosis not present

## 2023-07-24 NOTE — Telephone Encounter (Signed)
 Left message to call back. Sleep study has not been resulted. Will route to [place] where pt is seen for further follow up.

## 2023-07-25 NOTE — Telephone Encounter (Signed)
 Spoke with pt who states that she wore the monitor about 2 weeks ago.

## 2023-07-27 NOTE — Procedures (Signed)
   SLEEP STUDY REPORT Patient Information Study Date: 07/15/2023 Patient Name: [name] Patient ID: [id number] Birth Date: October 16, 1955 Age: 68 Gender: Female BMI: 47.2 (W=240 lb, H=5' 0'') Stopbang: 5 Referring Physician: Waddell Lusher, MD  TEST DESCRIPTION: Home sleep apnea testing was completed using the WatchPat, a Type 1 device, utilizing peripheral arterial tonometry (PAT), chest movement, actigraphy, pulse oximetry, pulse rate, body position and snore. AHI was calculated with apnea and hypopnea using valid sleep time as the denominator. RDI includes apneas, hypopneas, and RERAs. The data acquired and the scoring of sleep and all associated events were performed in accordance with the recommended standards and specifications as outlined in the AASM Manual for the Scoring of Sleep and Associated Events 2.2.0 (2015).  FINDINGS:  1. Mild Obstructive Sleep Apnea with AHI 8.3/hr overall and moderate during REM sleep with REM AHI 25/hr.  2. No Central Sleep Apnea with pAHIc /0hr.  3. Oxygen desaturations as low as 89%.  4. Mild snoring was present. O2 sats were < 88% for 0 min.  5. Total sleep time was 5 hrs and 40 min.  6. 22.2 % of total sleep time was spent in REM sleep.  7. Normal sleep onset latency at 21 min.  8. Prolonged REM sleep onset latency at 139 min.  9. Total awakenings were 6. 10. Arrhythmia detection: None  DIAGNOSIS: Mild Obstructive Sleep Apnea (G47.33)  RECOMMENDATIONS: 1. Clinical correlation of these findings is necessary. The decision to treat obstructive sleep apnea (OSA) is usually based on the presence of apnea symptoms or the presence of associated medical conditions such as Hypertension, Congestive Heart Failure, Atrial Fibrillation or Obesity. The most common symptoms of OSA are snoring, gasping for breath while sleeping, daytime sleepiness and fatigue. 2. Initiating apnea therapy is recommended given the presence of symptoms and/or associated  conditions. Recommend proceeding with one of the following:  a. Auto-CPAP therapy with a pressure range of 5-20cm H2O.  b. An oral appliance (OA) that can be obtained from certain dentists with expertise in sleep medicine. These are primarily of use in non-obese patients with mild and moderate disease.  c. An ENT consultation which may be useful to look for specific causes of obstruction and possible treatment options.  d. If patient is intolerant to PAP therapy, consider referral to ENT for evaluation for hypoglossal nerve stimulator. 3. Close follow-up is necessary to ensure success with CPAP or oral appliance therapy for maximum benefit . 4. A follow-up oximetry study on CPAP is recommended to assess the adequacy of therapy and determine the need for supplemental oxygen or the potential need for Bi-level therapy. An arterial blood gas to determine the adequacy of baseline ventilation and oxygenation should also be considered. 5. Healthy sleep recommendations include: adequate nightly sleep (normal 7-9 hrs/night), avoidance of caffeine after noon and alcohol near bedtime, and maintaining a sleep environment that is cool, dark and quiet. 6. Weight loss for overweight patients is recommended. Even modest amounts of weight loss can significantly improve the severity of sleep apnea. 7. Snoring recommendations include: weight loss where appropriate, side sleeping, and avoidance of alcohol before bed. 8. Operation of motor vehicle should be avoided when sleepy.  Signature: Wilbert Bihari, MD; [facility]; Diplomat, American Board of Sleep Medicine Electronically Signed: 07/27/2023 4:36:17 PM

## 2023-07-28 ENCOUNTER — Ambulatory Visit: Attending: Internal Medicine

## 2023-07-28 DIAGNOSIS — R0683 Snoring: Secondary | ICD-10-CM

## 2023-07-28 DIAGNOSIS — K2100 Gastro-esophageal reflux disease with esophagitis, without bleeding: Secondary | ICD-10-CM

## 2023-07-28 DIAGNOSIS — R002 Palpitations: Secondary | ICD-10-CM

## 2023-07-30 ENCOUNTER — Telehealth: Payer: Self-pay

## 2023-07-30 DIAGNOSIS — R002 Palpitations: Secondary | ICD-10-CM

## 2023-07-30 DIAGNOSIS — G4733 Obstructive sleep apnea (adult) (pediatric): Secondary | ICD-10-CM

## 2023-07-30 DIAGNOSIS — R0683 Snoring: Secondary | ICD-10-CM

## 2023-07-30 DIAGNOSIS — I471 Supraventricular tachycardia, unspecified: Secondary | ICD-10-CM

## 2023-07-30 DIAGNOSIS — I4719 Other supraventricular tachycardia: Secondary | ICD-10-CM

## 2023-07-30 NOTE — Telephone Encounter (Signed)
 Notified patient of sleep study results and recommendations. All questions were answered and patient verbalized understanding. Order for CPAP device and supplies sent to [facility] today.

## 2023-07-30 NOTE — Telephone Encounter (Signed)
-----   Message from Wilbert Bihari sent at 07/27/2023  4:38 PM EDT ----- Please let patient know that they have sleep apnea and recommend treating with CPAP.  Please order an auto CPAP from 4-15cm H2O with heated humidity and mask of choice.  Order overnight pulse ox on CPAP.  Followup with me in 6 weeks.

## 2023-08-12 ENCOUNTER — Telehealth: Payer: Self-pay | Admitting: *Deleted

## 2023-08-12 NOTE — Telephone Encounter (Signed)
 Per Dr. Jeannetta, please apply for Bil visco. Thank you.

## 2023-08-12 NOTE — Telephone Encounter (Signed)
 Yes, plan would be for visco injections since steroid injections had inadequate response x2 for her knee osteoarthritis. We can potentially reschedule her 8/6 appt to be for the injections once they are approved.

## 2023-08-12 NOTE — Telephone Encounter (Signed)
VOB submitted for SynviscOne, Bilateral knee(s) BV pending

## 2023-08-13 ENCOUNTER — Encounter: Payer: Self-pay | Admitting: Cardiology

## 2023-08-13 ENCOUNTER — Ambulatory Visit: Attending: Cardiology | Admitting: Cardiology

## 2023-08-13 VITALS — BP 116/60 | HR 62 | Ht 60.0 in | Wt 246.0 lb

## 2023-08-13 DIAGNOSIS — I4719 Other supraventricular tachycardia: Secondary | ICD-10-CM

## 2023-08-13 DIAGNOSIS — E782 Mixed hyperlipidemia: Secondary | ICD-10-CM | POA: Diagnosis not present

## 2023-08-13 NOTE — Patient Instructions (Signed)
 Medication Instructions:  Your physician recommends that you continue on your current medications as directed. Please refer to the Current Medication list given to you today.   Labwork: None today  Testing/Procedures: None today  Follow-Up: 6 months  Any Other Special Instructions Will Be Listed Below (If Applicable).  If you need a refill on your cardiac medications before your next appointment, please call your pharmacy.

## 2023-08-13 NOTE — Progress Notes (Deleted)
 Office Visit Note  Patient: [name]             Date of Birth: [birth year]           MRN: [id number]             PCP: Atilano Deward ORN, MD Referring: Atilano Deward ORN, MD Visit Date: 08/20/2023   Subjective:  Follow-up (Wants to discuss approval for her knee injections)   History of Present Illness: [name] is a 68 y.o. female here for follow up for osteoarthritis with previous b/l knee Hylan injections 11/19/22.    B/l knees injected ***  Previous HPI 05/20/2023 [name] is a 68 y.o. female here for follow up for osteoarthritis with previous b/l knee Hylan injections 11/19/22.    She previously received gel injections in her knees approximately six months ago. Initially, she did not notice a significant difference, but over time, she experienced improvement in her pain levels. Unlike the steroid injections, which provided immediate but short-lived relief, the gel injections have allowed her to maintain a more active lifestyle, including caring for her 80 year old mother and performing daily activities such as grocery shopping and refueling her car.   She experiences difficulty standing for extended periods due to her knees feeling like they are 'locking up.' She uses a cane for mobility and relies on support from objects around her. She applies Voltaren topically when she remembers, especially when her knees feel stiff, and takes turmeric daily as a supplement for her knee pain.   She has experienced increased ankle swelling recently, which she attributes to fluid pooling and varicose veins. She has a family history of similar venous issues on her mother's side, affecting both males and females. She has had treatment for varicose veins in the past, but new ones have developed over time. Both legs are swelling, and while there is some discomfort, it is not particularly painful.   No prior history of heart issues, but she recently experienced a rapid heart rate of 160 bpm during  a sinus infection, which led to an emergency room visit.       Previous HPI 09/10/22 [name] is a 68 y.o. female here for follow up for generalized osteoarthritis and some left leg swelling suspected as thrombophlebitis.  Steroid injections for both knees last visit with improvement she felt was a little bit longer than after the first injections, at least one month of a good relief. She also continues taking tylenol  regularly and topical voltaren and topical heat as needed especially for the left knee. She remains very active taking care of her 76 year old mother and knees lock up more commonly after about 1 hour on her feet or longer.   Previous HPI 06/11/2022 [name] is a 68 y.o. female here for follow up follow-up of generalized osteoarthritis and some left leg swelling suspected as thrombophlebitis.  Since her last visit continues joint pain and stiffness in multiple areas currently most problematic affecting both knees.  Usually worse with prolonged standing and walking.  Tender area of swelling on the left lateral knee has decreased in size and tenderness though not entirely resolved.  No new skin rash or increase in edema.   Previous HPI 04/09/22 [name] is a 68 y.o. female here for follow up for osteoarthritis joint pain involving shoulders and knees.  After repeat steroid injection 2 months ago she had symptom improvement.  Shoulder pain is doing well on  both sides though she still limited with her overhead range of motion.  Her knee injection from about 4 months ago starting to notice a little bit more pain and stiffness though still better than prior to treatment.  She has noticed some increased swelling in her left leg and there is a firm nodule or lump under the skin she notices just below the level of the knee.  Small increase in redness in that leg swelling.  With her starting to notice increased left knee symptoms again she has questions about viscosupplementation  treatment.   Previous HPI 02/12/22 [name] is a 68 y.o. female here for follow up for chronic osteoarthritis pain in multiple areas we did steroid injections in the right shoulder and left knee last month. She reports symptoms improved from 10/10 pain to about 5-6/10 pain after these shots. Also less aching or radiating pain. She has slight improvement in mobility and function in the right shoulder. Since the original shot notices some decline in the benefit so far, back to about 8/10 knee pain. She is interested in trying injection for her other shoulder and knee which remain problematic.   Previous HPI 01/03/22 [name] is a 68 y.o. female here for follow up for osteoarthritis in multiple areas also right shoulder xray suggestive for some subacromial bursitis or chronic injury with displacement or dislocation. Symptoms remain about the same since our initial visit. Right shoulder is the worst and limited ROM is difficult to dress. Knee pain remains most problematic along medial side of her joints.    Previous HPI 12/12/21 [name] is a 68 y.o. female here fgr evaluation of positive ANA and multiple joint pains. She has discontinued NSAIDs due to left sided colitis episodes without certain etiology established except suspected medication related. Has chronic joint pain worse now in multiple areas workup labs negative for RF, ESR, CRP, but low positive ANA 1:80 with negative ENA panel. She has also described hair loss in a generalized pattern and increased in longitudinal nail ridges these been worse during this recent time since the colitis symptoms.  She has had chronic joint pain in bilateral knees ongoing for years.  She estimates at least 5 or more years been self medicating with Aleve which is very beneficial for her joint pain and stiffness.  Initially taking daily and then had increased to twice daily consistently for much of this time.  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  She stopped this completely since her colitis problems and these have resolved but in addition to worsening of her knee pain and stiffness also developed trouble in her upper extremities and both shoulders and both hands especially the right shoulder.  Also abruptly developed a palpable knot and swelling in the right shoulder with decreased range of motion in the past year.  She does use a cane for stability and notices leaning on counters or carts more to offload her knees with prolonged standing or walking. She has not noticed any new problem with skin rashes, oral nasal ulcers, Raynaud's symptoms, no abnormal bruising or bleeding outside of the colitis.  She does have dry eye symptoms these are worse when reading or on a screen for prolonged periods but does not require eyedrops for symptoms every day.  She was also found to have very low serum vitamin D  level at lab testing earlier this year.  Review of Systems  Constitutional:  Positive for fatigue.  HENT:  Negative for mouth sores and mouth dryness.   Eyes:  Negative for dryness.  Respiratory:  Negative for shortness of breath.   Cardiovascular:  Negative for chest pain and palpitations.  Gastrointestinal:  Negative for blood in stool, constipation and diarrhea.  Endocrine: Negative for increased urination.  Genitourinary:  Positive for involuntary urination.  Musculoskeletal:  Positive for joint pain, gait problem, joint pain, joint swelling, myalgias, morning stiffness and myalgias. Negative for muscle weakness and muscle tenderness.  Skin:  Negative for color change, rash, hair loss and sensitivity to sunlight.  Allergic/Immunologic: Negative for susceptible to infections.  Neurological:  Negative for dizziness and headaches.  Hematological:  Negative for swollen glands.  Psychiatric/Behavioral:  Positive for depressed mood and sleep disturbance. The patient is  nervous/anxious.     PMFS History:  Patient Active Problem List   Diagnosis Date Noted   Bilateral primary osteoarthritis of knee 05/20/2023   Atrial fibrillation with RVR (HCC) 05/05/2023   SVT (supraventricular tachycardia) (HCC) 05/05/2023   Thrombophlebitis 04/09/2022   Generalized osteoarthritis of multiple sites 01/03/2022   Bilateral shoulder pain 01/03/2022   Positive ANA (antinuclear antibody) 12/12/2021   Vitamin D  deficiency 12/12/2021   Joint pain 09/05/2021   Multiple joint pain 09/05/2021   Hair loss 09/05/2021   Colitis 01/17/2021   Rectal bleeding 02/10/2020   Gastric polyps 02/10/2020   GERD (gastroesophageal reflux disease) 02/10/2020   Diarrhea 02/10/2020   Postmenopausal bleeding 07/20/2019   Symptomatic cholelithiasis 11/27/2018   Carpal tunnel syndrome on left 12/14/2014   Endometrial 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, complex 04/20/2014    Past Medical History:  Diagnosis Date   Anxiety    Arthritis    knees, thumbs   Atrial tachycardia (HCC)    Carpal tunnel syndrome of right wrist 09/2014   GERD (gastroesophageal reflux disease)    History of kidney stones 07/2014   Plantar fascial fibromatosis of left foot    Sinus problem    Sleep apnea 06/2023   SVT (supraventricular tachycardia) (HCC) 04/2023    Family History  Problem Relation Age of Onset   Diabetes Mother    Stroke Mother    Osteoarthritis Mother    Heart disease Father    Cancer Maternal Aunt        Breast   Colon cancer Neg Hx    Gastric cancer Neg Hx    Esophageal cancer Neg Hx    Past Surgical History:  Procedure Laterality Date   BIOPSY  09/04/2020   Procedure: BIOPSY;  Surgeon: Cindie Carlin POUR, DO;  [facility]: [facility];  Service: Endoscopy;;  descending colon polyp   CARPAL TUNNEL RELEASE Right 10/04/2014   Procedure: RIGHT CARPAL TUNNEL RELEASE;  Surgeon: Arley Curia, MD;  Location: [facility];  Service: Orthopedics;  Laterality: Right;  REG/FAB   CARPAL  TUNNEL RELEASE Left 11/15/2014   Procedure: LEFT CARPAL TUNNEL RELEASE;  Surgeon: Arley Curia, MD;  Location: [facility];  Service: Orthopedics;  Laterality: Left;   CERVICAL CONIZATION W/BX  04/2014   CHOLECYSTECTOMY  11/30/2014   COLONOSCOPY WITH PROPOFOL  N/A 09/04/2020   Procedure: COLONOSCOPY WITH PROPOFOL ;  Surgeon: Cindie Carlin POUR, DO;  Location: [facility];  Service: Endoscopy;  Laterality: N/A;  7:30am   DILATION AND CURETTAGE OF UTERUS  04/2014   DILATION AND CURETTAGE OF UTERUS N/A 08/10/2019   Procedure: DILATATION AND CURETTAGE OF THE UTERUS UNDER ULTRASOUND GUIDANCE;  Surgeon:  Eloy Herring, MD;  Location: [facility];  Service: Gynecology;  Laterality: N/A;   HYMENECTOMY  age 64 or age 89   hysteroscoptic insertion of mirena  IUD  08/2015   HYSTEROSCOPY  02/2014   INTRAUTERINE DEVICE (IUD) INSERTION N/A 08/10/2019   Procedure: INTRAUTERINE DEVICE (IUD) REMOVAL AND REINSERTION;  Surgeon: Eloy Herring, MD;  Location: [facility];  Service: Gynecology;  Laterality: N/A;  PHARMACY MIRENA  IUD   OPERATIVE ULTRASOUND N/A 08/10/2019   Procedure: OPERATIVE ULTRASOUND;  Surgeon: Eloy Herring, MD;  Location: [facility];  Service: Gynecology;  Laterality: N/A;   uterine polyps removed  06/03/2018   VARICOSE VEIN SURGERY Bilateral 15 yrs ago   tx with injections only no sx   WISDOM TOOTH EXTRACTION     as a teenager   Social History   Social History Narrative   Not on file    There is no immunization history on file for this patient.   Objective: Vital Signs: BP 113/74 (BP Location: Left Arm, Patient Position: Sitting, Cuff Size: Normal)   Pulse (!) 56   Resp 16   Ht 5' (1.524 m)   Wt 244 lb 6.4 oz (110.9 kg)   BMI 47.73 kg/m    Physical Exam   Musculoskeletal Exam: ***  CDAI Exam: CDAI Score: -- Patient Global: --; Provider Global: -- Swollen: --; Tender: -- Joint Exam 08/20/2023   No joint exam has been  documented for this visit   There is currently no information documented on the homunculus. Go to the Rheumatology activity and complete the homunculus joint exam.  Investigation: No additional findings.  Imaging: No results found.  Recent Labs: Lab Results  Component Value Date   WBC 7.4 05/07/2023   HGB 10.4 (L) 05/07/2023   PLT 280 05/07/2023   NA 138 05/07/2023   K 4.5 05/07/2023   CL 107 05/07/2023   CO2 25 05/07/2023   GLUCOSE 96 05/07/2023   BUN 16 05/07/2023   CREATININE 0.70 05/07/2023   BILITOT 0.4 05/05/2023   ALKPHOS 46 05/05/2023   AST 15 05/05/2023   ALT 11 05/05/2023   PROT 6.6 05/05/2023   ALBUMIN 3.3 (L) 05/05/2023   CALCIUM  8.8 (L) 05/07/2023   GFRAA >60 08/06/2019    Speciality Comments: No specialty comments available.  Procedures:  No procedures performed Allergies: Aleve [naproxen]   Assessment / Plan:     Visit Diagnoses: Bilateral primary osteoarthritis of knee  Generalized osteoarthritis of multiple sites  ***  Orders: No orders of the defined types were placed in this encounter.  No orders of the defined types were placed in this encounter.    Follow-Up Instructions: No follow-ups on file.   Lonni LELON Ester, MD  Note - This record has been created using [employer].  Chart creation errors have been sought, but may not always  have been located. Such creation errors do not reflect on  the standard of medical care.

## 2023-08-13 NOTE — Telephone Encounter (Signed)
 Please call to schedule visco injections.  Approved for SynviscOne, Bilateral knee(s). Buy & Bill No co-pay Deductible does not apply Once the OOP is met $4,150 (met [phone]) patient is covered at 100% Authorization #[id number] Authorization dates:  08/13/23 to 08/12/24

## 2023-08-13 NOTE — Progress Notes (Signed)
 Cardiology Office Note  Date: 08/13/2023   ID: [name], Pocius Aug 14, 1955, MRN [id number]  History of Present Illness: [name] is a 68 y.o. female presenting for office follow-up, I reviewed her records and recent evaluation by Dr. Alvan and Dr. Waddell..  She has recently documented atrial tachycardia responding to AV nodal blockers and plan for watchful waiting at this time.  She does not report any recurrent palpitations, no chest pain or unusual shortness of breath.  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  We went over her medications.  She continues on Cardizem  CD 120 mg daily and Lopressor  25 mg twice daily.  Crestor  is a relatively new medication, currently taking 5 mg every other day with LDL coming down from 168-121 so far.  She is following with PCP.  Physical Exam: VS:  BP 116/60 (BP Location: Right Arm, Patient Position: Sitting, Cuff Size: Large)   Pulse 62   Ht 5' (1.524 m)   Wt 246 lb (111.6 kg)   SpO2 97%   BMI 48.04 kg/m , BMI Body mass index is 48.04 kg/m.  Wt Readings from Last 3 Encounters:  08/13/23 246 lb (111.6 kg)  06/23/23 241 lb (109.3 kg)  06/13/23 237 lb 12.8 oz (107.9 kg)    General: Patient appears comfortable at rest. HEENT: Conjunctiva and lids normal. Neck: Supple, no elevated JVP or carotid bruits. Lungs: Clear to auscultation, nonlabored breathing at rest. Cardiac: Regular rate and rhythm, no S3 or significant systolic murmur, no pericardial rub.  ECG:  An ECG dated 05/05/2023 was personally reviewed today and demonstrated:  Sinus rhythm with nonspecific ST-T changes.  Labwork: 05/05/2023: ALT 11; AST 15; B Natriuretic Peptide 147.0; TSH 1.359 05/07/2023: BUN 16; Creatinine, Ser 0.70; Hemoglobin 10.4; Magnesium 2.0; Platelets 280; Potassium 4.5; Sodium 138  July 2025: Potassium 4.8, BUN 20, creatinine 0.76, GFR 85, AST 8, ALT 6, cholesterol 207, triglycerides 69, HDL 72, LDL 121  Other Studies  Reviewed Today:  Echocardiogram 05/07/2023:  1. Left ventricular ejection fraction, by estimation, is 50%. The left  ventricle has low normal function. Left ventricular endocardial border not  optimally defined to evaluate regional wall motion. There is mild left  ventricular hypertrophy. Left  ventricular diastolic parameters were normal.   2. Right ventricular systolic function is normal. The right ventricular  size is normal. Tricuspid regurgitation signal is inadequate for assessing  PA pressure.   3. Left atrial size was moderately dilated.   4. The mitral valve is abnormal. Moderate mitral valve regurgitation. No  evidence of mitral stenosis.   5. The tricuspid valve is abnormal.   6. The aortic valve was not well visualized. Aortic valve regurgitation  is not visualized. No aortic stenosis is present.   7. The inferior vena cava is dilated in size with >50% respiratory  variability, suggesting right atrial pressure of 8 mmHg.   Assessment and Plan:  1.  PSVT, atrial tachycardia recently documented and evaluated by Dr. Waddell with plan for watchful waiting in the setting of effective medical therapy.  Continue Cardizem  CD 120 mg daily and Lopressor  25 mg twice daily.  She does not report any recurring palpitations at this point.  2.  Mixed hyperlipidemia.  LDL 168 and HDL 73 in February.  She is on Crestor  5 mg twice daily per PCP with decrease in LDL to 121 recently.  But anticipate increasing to daily presuming she tolerates.  Disposition:  Follow up 6 months.  Signed, Jayson JUDITHANN Sierras, M.D., F.A.C.C. [facility]

## 2023-08-16 ENCOUNTER — Ambulatory Visit: Payer: Self-pay | Admitting: Internal Medicine

## 2023-08-20 ENCOUNTER — Encounter: Payer: Self-pay | Admitting: Internal Medicine

## 2023-08-20 ENCOUNTER — Ambulatory Visit: Attending: Internal Medicine | Admitting: Internal Medicine

## 2023-08-20 VITALS — BP 113/74 | HR 56 | Resp 16 | Ht 60.0 in | Wt 244.4 lb

## 2023-08-20 DIAGNOSIS — M159 Polyosteoarthritis, unspecified: Secondary | ICD-10-CM

## 2023-08-20 DIAGNOSIS — M170 Bilateral primary osteoarthritis of knee: Secondary | ICD-10-CM | POA: Diagnosis not present

## 2023-08-20 MED ORDER — LIDOCAINE HCL 1 % IJ SOLN
1.5000 mL | INTRAMUSCULAR | Status: AC | PRN
Start: 2023-08-20 — End: 2023-08-20
  Administered 2023-08-20: 1.5 mL

## 2023-08-20 MED ORDER — LIDOCAINE HCL 1 % IJ SOLN
1.5000 mL | INTRAMUSCULAR | Status: AC | PRN
  Administered 2023-08-20: 1.5 mL

## 2023-08-20 MED ORDER — HYLAN G-F 20 48 MG/6ML IX SOSY
48.0000 mg | PREFILLED_SYRINGE | INTRA_ARTICULAR | Status: AC | PRN
  Administered 2023-08-20: 48 mg via INTRA_ARTICULAR

## 2023-08-20 NOTE — Progress Notes (Signed)
   Procedure Note  Patient: [name]             Date of Birth: Mar 24, 1955           MRN: [id number]             Visit Date: 08/20/2023  Procedures:  Synvisc one Bilateral B/B Visit Diagnoses:  1. Bilateral primary osteoarthritis of knee   2. Generalized osteoarthritis of multiple sites     Large Joint Inj: bilateral knee on 08/20/2023 12:00 PM Indications: pain Details: 22 G 1.5 in needle, medial approach  Arthrogram: No  Medications (Right): 1.5 mL lidocaine  1 %; 48 mg Hylan G-F 20 48 MG/6ML Aspirate (Right): 0 mL Medications (Left): 1.5 mL lidocaine  1 %; 48 mg Hylan G-F 20 48 MG/6ML Aspirate (Left): 0 mL Outcome: tolerated well, no immediate complications Procedure, treatment alternatives, risks and benefits explained, specific risks discussed. Consent was given by the patient. Immediately prior to procedure a time out was called to verify the correct patient, procedure, equipment, support staff and site/side marked as required. Patient was prepped and draped in the usual sterile fashion.

## 2023-09-24 ENCOUNTER — Ambulatory Visit: Attending: Internal Medicine | Admitting: Internal Medicine

## 2023-09-24 ENCOUNTER — Encounter: Payer: Self-pay | Admitting: Internal Medicine

## 2023-09-24 VITALS — BP 134/76 | HR 58 | Ht 60.0 in | Wt 243.0 lb

## 2023-09-24 DIAGNOSIS — I471 Supraventricular tachycardia, unspecified: Secondary | ICD-10-CM

## 2023-09-24 NOTE — Patient Instructions (Signed)

## 2023-09-24 NOTE — Progress Notes (Signed)
 HPI [name] returns for evaluation of SVT. She is apleasant 68 yo woman with a h/o palpitations who presented back in April with heart racing of 158/min which was not responsive to adenosine  but was eventually controlled with cardizem . She wore a cardiac monitor as an outpatient and had recurrent SVT though lasting 21 minutes. She was seen in the [place] office just over a week ago with HR's of around 150/min and was prescribed lopressor  bid and her HR's have improved. She is only mildly symptomatic in SVT. Since I saw her last her SVT has been well controlled. She has a little fatigue on the combination of toprol  and cardizem  but this is mild. She cares full time for her 77 yo mother.  Allergies  Allergen Reactions   Aleve [Naproxen] Other (See Comments)    GI bleeding     Current Outpatient Medications  Medication Sig Dispense Refill   acetaminophen  (TYLENOL ) 500 MG tablet Take 1,000 mg by mouth See admin instructions. Take 1000 mg in the morning, may take an additional 1000 mg every 6 hours as needed for pain     Cholecalciferol (VITAMIN D ) 125 MCG (5000 UT) CAPS Take by mouth.     diltiazem  (CARDIZEM  CD) 120 MG 24 hr capsule Take 1 capsule (120 mg total) by mouth daily. 90 capsule 3   FLUoxetine  (PROZAC ) 20 MG capsule Take by mouth. Take 40 mg every other day and 20 mg on opposite days     metoprolol  tartrate (LOPRESSOR ) 25 MG tablet Take 1 tablet (25 mg total) by mouth 2 (two) times daily. 180 tablet 3   omeprazole (PRILOSEC) 20 MG capsule Take 20 mg by mouth daily.     rosuvastatin  (CRESTOR ) 5 MG tablet Take 5 mg by mouth every other day.     TURMERIC PO Take by mouth daily.     COLLAGEN PO Take by mouth. (Patient not taking: Reported on 09/24/2023)     fluticasone  (FLONASE ) 50 MCG/ACT nasal spray Place 2 sprays into both nostrils daily. (Patient not taking: Reported on 09/24/2023) 16 g 0   guaiFENesin -dextromethorphan  (ROBITUSSIN DM) 100-10 MG/5ML syrup Take 10 mLs by mouth  every 4 (four) hours as needed for cough. (Patient not taking: Reported on 09/24/2023) 118 mL 0   No current facility-administered medications for this visit.     Past Medical History:  Diagnosis Date   Anxiety    Arthritis    knees, thumbs   Atrial tachycardia (HCC)    Carpal tunnel syndrome of right wrist 09/2014   GERD (gastroesophageal reflux disease)    History of kidney stones 07/2014   Plantar fascial fibromatosis of left foot    Sinus problem    Sleep apnea 06/2023   SVT (supraventricular tachycardia) (HCC) 04/2023    ROS:   All systems reviewed and negative except as noted in the HPI.   Past Surgical History:  Procedure Laterality Date   BIOPSY  09/04/2020   Procedure: BIOPSY;  Surgeon: Cindie Carlin POUR, DO;  [facility];  Service: Endoscopy;;  descending colon polyp   CARPAL TUNNEL RELEASE Right 10/04/2014   Procedure: RIGHT CARPAL TUNNEL RELEASE;  Surgeon: Arley Curia, MD;  Location: [facility];  Service: Orthopedics;  Laterality: Right;  REG/FAB   CARPAL TUNNEL RELEASE Left 11/15/2014   Procedure: LEFT CARPAL TUNNEL RELEASE;  Surgeon: Arley Curia, MD;  Location: [facility];  Service: Orthopedics;  Laterality: Left;   CERVICAL CONIZATION W/BX  04/2014   CHOLECYSTECTOMY  11/30/2014   COLONOSCOPY WITH PROPOFOL  N/A 09/04/2020   Procedure: COLONOSCOPY WITH PROPOFOL ;  Surgeon: Cindie Carlin POUR, DO;  [facility];  Service: Endoscopy;  Laterality: N/A;  7:30am   DILATION AND CURETTAGE OF UTERUS  04/2014   DILATION AND CURETTAGE OF UTERUS N/A 08/10/2019   Procedure: DILATATION AND CURETTAGE OF THE UTERUS UNDER ULTRASOUND GUIDANCE;  Surgeon: Eloy Herring, MD;  Location: [facility];  Service: Gynecology;  Laterality: N/A;   HYMENECTOMY  age 7 or age 47   hysteroscoptic insertion of mirena  IUD  08/2015   HYSTEROSCOPY  02/2014   INTRAUTERINE DEVICE (IUD) INSERTION N/A 08/10/2019   Procedure: INTRAUTERINE DEVICE  (IUD) REMOVAL AND REINSERTION;  Surgeon: Eloy Herring, MD;  Location: [facility];  Service: Gynecology;  Laterality: N/A;  PHARMACY MIRENA  IUD   OPERATIVE ULTRASOUND N/A 08/10/2019   Procedure: OPERATIVE ULTRASOUND;  Surgeon: Eloy Herring, MD;  Location: [facility];  Service: Gynecology;  Laterality: N/A;   uterine polyps removed  06/03/2018   VARICOSE VEIN SURGERY Bilateral 15 yrs ago   tx with injections only no sx   WISDOM TOOTH EXTRACTION     as a teenager     Family History  Problem Relation Age of Onset   Diabetes Mother    Stroke Mother    Osteoarthritis Mother    Heart disease Father    Cancer Maternal Aunt        Breast   Colon cancer Neg Hx    Gastric cancer Neg Hx    Esophageal cancer Neg Hx      Social History   Socioeconomic History   Marital status: Widowed    Spouse name: Not on file   Number of children: Not on file   Years of education: Not on file   Highest education level: Not on file  Occupational History   Not on file  Tobacco Use   Smoking status: Never    Passive exposure: Past   Smokeless tobacco: Never  Vaping Use   Vaping status: Never Used  Substance and Sexual Activity   Alcohol use: No   Drug use: No   Sexual activity: Not Currently  Other Topics Concern   Not on file  Social History Narrative   Not on file   Social Drivers of Health   Financial Resource Strain: Not on file  Food Insecurity: No Food Insecurity (05/05/2023)   Hunger Vital Sign    Worried About Running Out of Food in the Last Year: Never true    Ran Out of Food in the Last Year: Never true  Transportation Needs: No Transportation Needs (05/05/2023)   PRAPARE - Administrator, Civil Service (Medical): No    Lack of Transportation (Non-Medical): No  Physical Activity: Not on file  Stress: Not on file  Social Connections: Moderately Integrated (05/05/2023)   Social Connection and Isolation Panel    Frequency of Communication  with Friends and Family: More than three times a week    Frequency of Social Gatherings with Friends and Family: More than three times a week    Attends Religious Services: More than 4 times per year    Active Member of [employer] or Organizations: Yes    Attends Banker Meetings: More than 4 times per year    Marital Status: Widowed  Intimate Partner Violence: Not At Risk (05/05/2023)   Humiliation, Afraid, Rape, and Kick questionnaire  Fear of Current or Ex-Partner: No    Emotionally Abused: No    Physically Abused: No    Sexually Abused: No     BP 134/76 (BP Location: Right Arm, Cuff Size: Normal)   Pulse (!) 58   Ht 5' (1.524 m)   Wt 243 lb (110.2 kg)   SpO2 98%   BMI 47.46 kg/m   Physical Exam:  Well appearing NAD HEENT: Unremarkable Neck:  No JVD, no thyromegally Lymphatics:  No adenopathy Back:  No CVA tenderness Lungs:  Clear HEART:  Regular rate rhythm, no murmurs, no rubs, no clicks Abd:  soft, positive bowel sounds, no organomegally, no rebound, no guarding Ext:  2 plus pulses, no edema, no cyanosis, no clubbing Skin:  No rashes no nodules Neuro:  CN II through XII intact, motor grossly intact  Assess/Plan: SVT - she is stable. Continue current meds. If her heart racing remains quiet, she could eventually followup as needed. HTN - her bp is controlled. We will follow.   Danelle Ridgely Anastacio,MD

## 2023-09-29 DIAGNOSIS — Z23 Encounter for immunization: Secondary | ICD-10-CM | POA: Diagnosis not present

## 2023-11-06 NOTE — Progress Notes (Deleted)
 Office Visit Note  Patient: [name]             Date of Birth: [birth year]           MRN: [id number]             PCP: Atilano Deward ORN, MD Referring: Atilano Deward ORN, MD Visit Date: 11/20/2023   Subjective:  No chief complaint on file.   History of Present Illness: [name] is a 68 y.o. female here for follow up for osteoarthritis with previous b/l knee Hylan injections 11/19/22.    Previous HPI 05/20/2023 [name] is a 68 y.o. female here for follow up for osteoarthritis with previous b/l knee Hylan injections 11/19/22.    She previously received gel injections in her knees approximately six months ago. Initially, she did not notice a significant difference, but over time, she experienced improvement in her pain levels. Unlike the steroid injections, which provided immediate but short-lived relief, the gel injections have allowed her to maintain a more active lifestyle, including caring for her 40 year old mother and performing daily activities such as grocery shopping and refueling her car.   She experiences difficulty standing for extended periods due to her knees feeling like they are 'locking up.' She uses a cane for mobility and relies on support from objects around her. She applies Voltaren topically when she remembers, especially when her knees feel stiff, and takes turmeric daily as a supplement for her knee pain.   She has experienced increased ankle swelling recently, which she attributes to fluid pooling and varicose veins. She has a family history of similar venous issues on her mother's side, affecting both males and females. She has had treatment for varicose veins in the past, but new ones have developed over time. Both legs are swelling, and while there is some discomfort, it is not particularly painful.   No prior history of heart issues, but she recently experienced a rapid heart rate of 160 bpm during a sinus infection, which led to an emergency room visit.        Previous HPI 09/10/22 [name] is a 68 y.o. female here for follow up for generalized osteoarthritis and some left leg swelling suspected as thrombophlebitis.  Steroid injections for both knees last visit with improvement she felt was a little bit longer than after the first injections, at least one month of a good relief. She also continues taking tylenol  regularly and topical voltaren and topical heat as needed especially for the left knee. She remains very active taking care of her 34 year old mother and knees lock up more commonly after about 1 hour on her feet or longer.   Previous HPI 06/11/2022 [name] is a 68 y.o. female here for follow up follow-up of generalized osteoarthritis and some left leg swelling suspected as thrombophlebitis.  Since her last visit continues joint pain and stiffness in multiple areas currently most problematic affecting both knees.  Usually worse with prolonged standing and walking.  Tender area of swelling on the left lateral knee has decreased in size and tenderness though not entirely resolved.  No new skin rash or increase in edema.   Previous HPI 04/09/22 [name] is a 68 y.o. female here for follow up for osteoarthritis joint pain involving shoulders and knees.  After repeat steroid injection 2 months ago she had symptom improvement.  Shoulder pain is doing well on both sides though she still limited with her overhead  range of motion.  Her knee injection from about 4 months ago starting to notice a little bit more pain and stiffness though still better than prior to treatment.  She has noticed some increased swelling in her left leg and there is a firm nodule or lump under the skin she notices just below the level of the knee.  Small increase in redness in that leg swelling.  With her starting to notice increased left knee symptoms again she has questions about viscosupplementation treatment.   Previous HPI 02/12/22 [name] is a 68 y.o. female  here for follow up for chronic osteoarthritis pain in multiple areas we did steroid injections in the right shoulder and left knee last month. She reports symptoms improved from 10/10 pain to about 5-6/10 pain after these shots. Also less aching or radiating pain. She has slight improvement in mobility and function in the right shoulder. Since the original shot notices some decline in the benefit so far, back to about 8/10 knee pain. She is interested in trying injection for her other shoulder and knee which remain problematic.   Previous HPI 01/03/22 [name] is a 68 y.o. female here for follow up for osteoarthritis in multiple areas also right shoulder xray suggestive for some subacromial bursitis or chronic injury with displacement or dislocation. Symptoms remain about the same since our initial visit. Right shoulder is the worst and limited ROM is difficult to dress. Knee pain remains most problematic along medial side of her joints.    Previous HPI 12/12/21 [name] is a 68 y.o. female here fgr evaluation of positive ANA and multiple joint pains. She has discontinued NSAIDs due to left sided colitis episodes without certain etiology established except suspected medication related. Has chronic joint pain worse now in multiple areas workup labs negative for RF, ESR, CRP, but low positive ANA 1:80 with negative ENA panel. She has also described hair loss in a generalized pattern and increased in longitudinal nail ridges these been worse during this recent time since the colitis symptoms.  She has had chronic joint pain in bilateral knees ongoing for years.  She estimates at least 5 or more years been self medicating with Aleve which is very beneficial for her joint pain and stiffness.  Initially taking daily and then had increased to twice daily consistently for much of this time.  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  She  stopped this completely since her colitis problems and these have resolved but in addition to worsening of her knee pain and stiffness also developed trouble in her upper extremities and both shoulders and both hands especially the right shoulder.  Also abruptly developed a palpable knot and swelling in the right shoulder with decreased range of motion in the past year.  She does use a cane for stability and notices leaning on counters or carts more to offload her knees with prolonged standing or walking. She has not noticed any new problem with skin rashes, oral nasal ulcers, Raynaud's symptoms, no abnormal bruising or bleeding outside of the colitis.  She does have dry eye symptoms these are worse when reading or on a screen for prolonged periods but does not require eyedrops for symptoms every day.  She was also found to have very low serum vitamin D  level at lab testing earlier this year.    No Rheumatology ROS completed.   PMFS History:  Patient Active Problem List   Diagnosis Date Noted   Bilateral primary osteoarthritis of knee 05/20/2023   Atrial fibrillation with RVR (HCC) 05/05/2023   SVT (supraventricular tachycardia) 05/05/2023   Thrombophlebitis 04/09/2022   Generalized osteoarthritis of multiple sites 01/03/2022   Bilateral shoulder pain 01/03/2022   Positive ANA (antinuclear antibody) 12/12/2021   Vitamin D  deficiency 12/12/2021   Joint pain 09/05/2021   Multiple joint pain 09/05/2021   Hair loss 09/05/2021   Colitis 01/17/2021   Rectal bleeding 02/10/2020   Gastric polyps 02/10/2020   GERD (gastroesophageal reflux disease) 02/10/2020   Diarrhea 02/10/2020   Postmenopausal bleeding 07/20/2019   Symptomatic cholelithiasis 11/27/2018   Carpal tunnel syndrome on left 12/14/2014   Endometrial 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, complex 04/20/2014    Past Medical History:  Diagnosis Date   Anxiety    Arthritis    knees, thumbs   Atrial tachycardia    Carpal tunnel syndrome of  right wrist 09/2014   GERD (gastroesophageal reflux disease)    History of kidney stones 07/2014   Plantar fascial fibromatosis of left foot    Sinus problem    Sleep apnea 06/2023   SVT (supraventricular tachycardia) 04/2023    Family History  Problem Relation Age of Onset   Diabetes Mother    Stroke Mother    Osteoarthritis Mother    Heart disease Father    Cancer Maternal Aunt        Breast   Colon cancer Neg Hx    Gastric cancer Neg Hx    Esophageal cancer Neg Hx    Past Surgical History:  Procedure Laterality Date   BIOPSY  09/04/2020   Procedure: BIOPSY;  Surgeon: Cindie Carlin POUR, DO;  [facility];  Service: Endoscopy;;  descending colon polyp   CARPAL TUNNEL RELEASE Right 10/04/2014   Procedure: RIGHT CARPAL TUNNEL RELEASE;  Surgeon: Arley Curia, MD;  Location: [facility];  Service: Orthopedics;  Laterality: Right;  REG/FAB   CARPAL TUNNEL RELEASE Left 11/15/2014   Procedure: LEFT CARPAL TUNNEL RELEASE;  Surgeon: Arley Curia, MD;  [facility];  Service: Orthopedics;  Laterality: Left;   CERVICAL CONIZATION W/BX  04/2014   CHOLECYSTECTOMY  11/30/2014   COLONOSCOPY WITH PROPOFOL  N/A 09/04/2020   Procedure: COLONOSCOPY WITH PROPOFOL ;  Surgeon: Cindie Carlin POUR, DO;  [facility];  Service: Endoscopy;  Laterality: N/A;  7:30am   DILATION AND CURETTAGE OF UTERUS  04/2014   DILATION AND CURETTAGE OF UTERUS N/A 08/10/2019   Procedure: DILATATION AND CURETTAGE OF THE UTERUS UNDER ULTRASOUND GUIDANCE;  Surgeon: Eloy Herring, MD;  Location: [facility];  Service: Gynecology;  Laterality: N/A;   HYMENECTOMY  age 64 or age 60   hysteroscoptic insertion of mirena  IUD  08/2015   HYSTEROSCOPY  02/2014   INTRAUTERINE DEVICE (IUD) INSERTION N/A 08/10/2019   Procedure: INTRAUTERINE DEVICE (IUD) REMOVAL AND REINSERTION;  Surgeon: Eloy Herring, MD;  Location: [facility];  Service: Gynecology;  Laterality:  N/A;  PHARMACY MIRENA  IUD   OPERATIVE ULTRASOUND N/A 08/10/2019   Procedure: OPERATIVE ULTRASOUND;  Surgeon: Eloy Herring, MD;  [facility];  Service: Gynecology;  Laterality: N/A;   uterine polyps removed  06/03/2018   VARICOSE VEIN SURGERY Bilateral 15 yrs ago   tx with injections only no sx   WISDOM TOOTH EXTRACTION     as a teenager   Social History   Social History Narrative   Not on  file    There is no immunization history on file for this patient.   Objective: Vital Signs: There were no vitals taken for this visit.   Physical Exam   Musculoskeletal Exam: ***  CDAI Exam: CDAI Score: -- Patient Global: --; Provider Global: -- Swollen: --; Tender: -- Joint Exam 11/20/2023   No joint exam has been documented for this visit   There is currently no information documented on the homunculus. Go to the Rheumatology activity and complete the homunculus joint exam.  Investigation: No additional findings.  Imaging: No results found.  Recent Labs: Lab Results  Component Value Date   WBC 7.4 05/07/2023   HGB 10.4 (L) 05/07/2023   PLT 280 05/07/2023   NA 138 05/07/2023   K 4.5 05/07/2023   CL 107 05/07/2023   CO2 25 05/07/2023   GLUCOSE 96 05/07/2023   BUN 16 05/07/2023   CREATININE 0.70 05/07/2023   BILITOT 0.4 05/05/2023   ALKPHOS 46 05/05/2023   AST 15 05/05/2023   ALT 11 05/05/2023   PROT 6.6 05/05/2023   ALBUMIN 3.3 (L) 05/05/2023   CALCIUM  8.8 (L) 05/07/2023   GFRAA >60 08/06/2019    Speciality Comments: No specialty comments available.  Procedures:  No procedures performed Allergies: Aleve [naproxen]   Assessment / Plan:     Visit Diagnoses: No diagnosis found.  ***  Orders: No orders of the defined types were placed in this encounter.  No orders of the defined types were placed in this encounter.    Follow-Up Instructions: No follow-ups on file.   Ahrianna Siglin M Adalin Vanderploeg, CMA  Note - This record has been created  using Animal nutritionist.  Chart creation errors have been sought, but may not always  have been located. Such creation errors do not reflect on  the standard of medical care.

## 2023-11-20 ENCOUNTER — Ambulatory Visit: Admitting: Internal Medicine

## 2023-11-20 DIAGNOSIS — M170 Bilateral primary osteoarthritis of knee: Secondary | ICD-10-CM

## 2023-12-26 DIAGNOSIS — H524 Presbyopia: Secondary | ICD-10-CM | POA: Diagnosis not present

## 2023-12-26 DIAGNOSIS — H35361 Drusen (degenerative) of macula, right eye: Secondary | ICD-10-CM | POA: Diagnosis not present

## 2024-04-20 ENCOUNTER — Ambulatory Visit: Admitting: Cardiology
